# Patient Record
Sex: Female | Born: 1935
Health system: Southern US, Community
[De-identification: ages and names within clinical notes are randomized; demographics above are authoritative.]

## PROBLEM LIST (undated history)

## (undated) DIAGNOSIS — K589 Irritable bowel syndrome without diarrhea: Secondary | ICD-10-CM

## (undated) DIAGNOSIS — R011 Cardiac murmur, unspecified: Secondary | ICD-10-CM

## (undated) DIAGNOSIS — I272 Pulmonary hypertension, unspecified: Secondary | ICD-10-CM

## (undated) DIAGNOSIS — I341 Nonrheumatic mitral (valve) prolapse: Secondary | ICD-10-CM

## (undated) DIAGNOSIS — J449 Chronic obstructive pulmonary disease, unspecified: Secondary | ICD-10-CM

## (undated) HISTORY — DX: Nonrheumatic mitral (valve) prolapse: I34.1

## (undated) HISTORY — DX: Pulmonary hypertension, unspecified: I27.20

## (undated) HISTORY — DX: Chronic obstructive pulmonary disease, unspecified: J44.9

## (undated) HISTORY — PX: ABCESS DRAINAGE: SHX399

## (undated) HISTORY — DX: Irritable bowel syndrome, unspecified: K58.9

## (undated) HISTORY — DX: Cardiac murmur, unspecified: R01.1

## (undated) HISTORY — PX: APPENDECTOMY: SHX54

## (undated) HISTORY — PX: TONSILLECTOMY: SUR1361

---

## 2004-10-21 ENCOUNTER — Ambulatory Visit: Payer: Self-pay

## 2004-10-24 ENCOUNTER — Ambulatory Visit: Payer: Self-pay

## 2004-10-29 ENCOUNTER — Ambulatory Visit: Payer: Self-pay

## 2005-02-15 ENCOUNTER — Emergency Department: Payer: Self-pay | Admitting: Emergency Medicine

## 2005-07-15 ENCOUNTER — Ambulatory Visit: Payer: Self-pay | Admitting: Gastroenterology

## 2005-10-19 ENCOUNTER — Ambulatory Visit: Payer: Self-pay | Admitting: Family Medicine

## 2005-11-25 ENCOUNTER — Ambulatory Visit: Payer: Self-pay | Admitting: General Surgery

## 2006-01-21 ENCOUNTER — Ambulatory Visit: Payer: Self-pay | Admitting: Specialist

## 2006-02-08 ENCOUNTER — Emergency Department: Payer: Self-pay | Admitting: Emergency Medicine

## 2006-03-11 ENCOUNTER — Ambulatory Visit: Payer: Self-pay | Admitting: Specialist

## 2006-03-12 ENCOUNTER — Ambulatory Visit: Payer: Self-pay | Admitting: Orthopedic Surgery

## 2006-05-12 ENCOUNTER — Ambulatory Visit: Payer: Self-pay | Admitting: General Surgery

## 2006-12-06 ENCOUNTER — Ambulatory Visit: Payer: Self-pay | Admitting: General Surgery

## 2007-11-26 ENCOUNTER — Other Ambulatory Visit: Payer: Self-pay

## 2007-11-26 ENCOUNTER — Emergency Department: Payer: Self-pay | Admitting: Unknown Physician Specialty

## 2008-02-06 ENCOUNTER — Other Ambulatory Visit: Payer: Self-pay

## 2008-02-06 ENCOUNTER — Ambulatory Visit: Payer: Self-pay | Admitting: Ophthalmology

## 2008-02-06 ENCOUNTER — Ambulatory Visit: Payer: Self-pay | Admitting: Cardiology

## 2008-02-08 ENCOUNTER — Ambulatory Visit: Payer: Self-pay | Admitting: Internal Medicine

## 2008-02-20 ENCOUNTER — Ambulatory Visit: Payer: Self-pay | Admitting: Ophthalmology

## 2008-11-20 ENCOUNTER — Encounter: Payer: Self-pay | Admitting: Internal Medicine

## 2008-12-20 ENCOUNTER — Encounter: Payer: Self-pay | Admitting: Cardiovascular Disease

## 2009-05-27 ENCOUNTER — Ambulatory Visit: Payer: Self-pay | Admitting: Internal Medicine

## 2009-06-21 ENCOUNTER — Encounter: Payer: Self-pay | Admitting: Cardiovascular Disease

## 2010-02-26 ENCOUNTER — Ambulatory Visit: Payer: Self-pay | Admitting: Cardiovascular Disease

## 2010-02-26 DIAGNOSIS — J4489 Other specified chronic obstructive pulmonary disease: Secondary | ICD-10-CM | POA: Insufficient documentation

## 2010-02-26 DIAGNOSIS — J449 Chronic obstructive pulmonary disease, unspecified: Secondary | ICD-10-CM

## 2010-02-26 DIAGNOSIS — I2789 Other specified pulmonary heart diseases: Secondary | ICD-10-CM | POA: Insufficient documentation

## 2010-02-27 ENCOUNTER — Encounter: Payer: Self-pay | Admitting: Cardiovascular Disease

## 2010-02-28 ENCOUNTER — Telehealth: Payer: Self-pay | Admitting: Cardiovascular Disease

## 2010-03-03 ENCOUNTER — Encounter: Payer: Self-pay | Admitting: Cardiovascular Disease

## 2010-03-05 ENCOUNTER — Encounter: Payer: Self-pay | Admitting: Cardiovascular Disease

## 2010-03-12 ENCOUNTER — Ambulatory Visit: Payer: Self-pay | Admitting: Internal Medicine

## 2010-03-17 ENCOUNTER — Encounter: Payer: Self-pay | Admitting: Cardiovascular Disease

## 2010-04-09 ENCOUNTER — Encounter: Payer: Self-pay | Admitting: Cardiovascular Disease

## 2010-04-29 ENCOUNTER — Encounter: Payer: Self-pay | Admitting: Cardiovascular Disease

## 2010-05-07 ENCOUNTER — Ambulatory Visit: Payer: Self-pay | Admitting: Anesthesiology

## 2010-09-03 ENCOUNTER — Ambulatory Visit: Payer: Self-pay | Admitting: Internal Medicine

## 2010-10-07 NOTE — Letter (Signed)
Summary: release of records  release of records   Imported By: Frazier Butt Chriscoe 03/05/2010 15:31:25  _____________________________________________________________________  External Attachment:    Type:   Image     Comment:   External Document

## 2010-10-07 NOTE — Progress Notes (Signed)
----   Converted from flag ---- ---- 02/28/2010 2:48 PM, Bishop Dublin, CMA wrote: Toni Amend with Home health called on Cathy Russell (DOB: 1924/08/08) regarding referral for COPD.  Toni Amend spoke with [name] today and she doesn't want anyone to come today 02/28/10,03/01/10 or 03/02/10. Patient has not refused home health but doesn't want anyone there for the weekend.    Toni Amend Phone [phone]  Thank you, Gwynneth Munson ------------------------------

## 2010-10-07 NOTE — Letter (Signed)
Summary: Historic Patient File  Historic Patient File   Imported By: Park Breed 03/17/2010 08:57:52  _____________________________________________________________________  External Attachment:    Type:   Image     Comment:   External Document

## 2010-10-07 NOTE — Letter (Signed)
Summary: Historic Patient File  Historic Patient File   Imported By: West Carbo 02/27/2010 12:07:32  _____________________________________________________________________  External Attachment:    Type:   Image     Comment:   External Document

## 2010-10-07 NOTE — Assessment & Plan Note (Signed)
Summary: NP6/AMD   Visit Type:  Initial Consult Primary Provider:  Duncan Dull, M.D.  CC:  c/o thumping in chest, swelling in ankles, rapid heart beats, s.o.b., and lightheadedness.Cathy Russell  History of Present Illness: Cathy Russell is a very pleasant 77 year old woman with a history of COPD, 40 year smoking history stopped in the early 1990s, history of palpitations, edema, chronic back pain he uses oxygen and a walker and is essentially housebound, inflammatory bowel disease with periodic flares that take her to the emergency room who presents to establish care for her cardiac issues.  We do not have all of her apart from an echocardiogram from 5 years ago. This showed moderate tricuspid regurgitation, systolic function of 50%. No estimation was made of her right ventricular systolic pressures.  She takes Lasix daily for her edema and shortness of breath. Her blood pressure has been well-controlled. Her breathing is poor though stable though some days if she drinks or eats too much salt, she has more shortness of breath. She has occasional palpitations though she states these are manageable  EKG shows normal sinus rhythm with rate of 73 beats per minute, first degree AV block, right bundle branch block with left anterior fascicular block. No significant ST or T wave changes  Preventive Screening-Counseling & Management  Alcohol-Tobacco     Smoking Status: quit > 6 months      Drug Use:  no.     Current Medications (verified): 1)  Synthroid 88 Mcg Tabs (Levothyroxine Sodium) .Cathy Russell.. 1 Once Daily 2)  Omeprazole 20 Mg Cpdr (Omeprazole) .Cathy Russell.. 1 Once Daily 3)  Furosemide 40 Mg Tabs (Furosemide) .Cathy Russell.. 1 Once Daily 4)  Metoprolol Tartrate 50 Mg Tabs (Metoprolol Tartrate) .Cathy Russell.. 1 Two Times A Day 5)  Losartan Potassium 50 Mg Tabs (Losartan Potassium) .Cathy Russell.. 1 Once Daily 6)  Spiriva Handihaler 18 Mcg Caps (Tiotropium Bromide Monohydrate) .... 2 Puffs Once Daily 7)  Advair Diskus 100-50 Mcg/dose Aepb  (Fluticasone-Salmeterol) .... 2 Puffs Once Daily 8)  Lovastatin 20 Mg Tabs (Lovastatin) .Cathy Russell.. 1 At Bedtime 9)  Aspirin 81 Mg Chew (Aspirin) .Cathy Russell.. 1 Once Daily 10)  Centrum Silver  Tabs (Multiple Vitamins-Minerals) .Cathy Russell.. 1 Once Daily 11)  Vitamin D3 1000 Unit Tabs (Cholecalciferol) .Cathy Russell.. 1 Once Daily 12)  Calcium Carbonate 600 Mg Tabs (Calcium Carbonate) .Cathy Russell.. 1-2 Once Daily 13)  Flonase 50 Mcg/act Susp (Fluticasone Propionate) .Cathy Russell.. 1 Spray Each Nostril Once Daily 14)  Hyomax-Sl 0.125 Mg Subl (Hyoscyamine Sulfate) .... As Needed 15)  Meclizine Hcl 12.5 Mg Tabs (Meclizine Hcl) .... As Needed 16)  Hydrocodone-Acetaminophen 5-500 Mg Tabs (Hydrocodone-Acetaminophen) .... As Needed 17)  Donnatal 16.2 Mg/81ml Elix (Belladonna Alk-Phenobarbital) .... As Needed 18)  Oxygen  Allergies (verified): 1)  ! Pcn 2)  ! Erythromycin 3)  ! Vibramycin 4)  ! Sulfa  Past History:  Family History: Last updated: 02/26/2010 rheumatic fever: Brother pacemaker: Brother asthma:mother emphysema:father aneurysm: daugter  Social History: Last updated: 02/26/2010 Tobacco Use - Former. quit 1991;smoked x 43 yrs. Alcohol Use - no Drug Use - no  Risk Factors: Smoking Status: quit > 6 months (02/26/2010)  Past Medical History: C O P D MVP IBS  Past Surgical History: Appendectomy Tonsillectomy Bilateral abcess in arms  Family History: rheumatic fever: Brother pacemaker: Brother asthma:mother emphysema:father aneurysm: daugter  Social History: Tobacco Use - Former. quit 1991;smoked x 43 yrs. Alcohol Use - no Drug Use - no Smoking Status:  quit > 6 months Drug Use:  no  Review of Systems  The patient complains of dyspnea on exertion and difficulty walking.  The patient denies fever, weight loss, weight gain, vision loss, decreased hearing, hoarseness, chest pain, syncope, peripheral edema, prolonged cough, abdominal pain, incontinence, muscle weakness, depression, and enlarged lymph nodes.           Severe back pain  Vital Signs:  Patient profile:   77 year old female Height:      65 inches Weight:      175 pounds BMI:     29.23 Pulse rate:   73 / minute BP sitting:   122 / 64  (left arm) Cuff size:   large  Vitals Entered By: Bishop Dublin, CMA (February 26, 2010 3:46 PM)  Physical Exam  General:  Well developed, well nourished, in no acute distress. Head:  normocephalic and atraumatic Neck:  Neck supple, no JVD. No masses, thyromegaly or abnormal cervical nodes. Lungs:  Mildly decreased BS throughout b/l Heart:  Non-displaced PMI, chest non-tender; regular rate and rhythm, S1, S2 with II/VI SEM LSB, no rubs or gallops. Carotid upstroke normal, no bruit.  Pedals normal pulses. Trace LE  edema, no varicosities. Abdomen:  Bowel sounds positive; abdomen soft and non-tender without masses Msk:  Back normal, normal gait. Muscle strength and tone normal. Pulses:  pulses normal in all 4 extremities Extremities:  No clubbing or cyanosis. Neurologic:  Alert and oriented x 3.   Impression & Recommendations:  Problem # 1:  PULMONARY HYPERTENSION (ICD-416.8) history of shortness of breath, tricuspid regurgitation, lower extremity edema is consistent with pulmonary hypertension Will try to obtain her most recent echo and stress test from the outside clinic. She is on Lasix daily and she is due to have her blood work checked tomorrow with Dr. Darrick Huntsman. Her shortness of breath has been stable. 4 increasing shortness of breath, we would increase her Lasix and have her cut back on her fluid intake.  She has significant difficulty at home due to her chronic back pain, shortness of breath, pulmonary hypertension and desaturations. She is essentially homebound. I will put a request into a home service as she reports she has a messy house and has difficulty maintaining the household, shopping, ADLs.  Orders: Home Health Referral (Home Health)  Problem # 2:  CHRONIC AIRWAY OBSTRUCTION NEC  (ICD-496) Long smoking history. She is on metoprolol. If her breathing gets worse, one possibility would be to change her to Bystolic.  Her updated medication list for this problem includes:    Spiriva Handihaler 18 Mcg Caps (Tiotropium bromide monohydrate) .Cathy Russell... 2 puffs once daily    Advair Diskus 100-50 Mcg/dose Aepb (Fluticasone-salmeterol) .Cathy Russell... 2 puffs once daily  Orders: Home Health Referral [facility])  Patient Instructions: 1)  Your physician recommends that you continue on your current medications as directed. Please refer to the Current Medication list given to you today. 2)  Your physician wants you to follow-up in:  6 MONTHS You will receive a reminder letter in the mail two months in advance. If you don't receive a letter, please call our office to schedule the follow-up appointment. 3)  Home Health referral being made

## 2010-10-07 NOTE — Miscellaneous (Signed)
Summary: Orders Update  Clinical Lists Changes  Orders: Added new Referral order of Home Health Referral (Home Health) - Signed 

## 2010-10-07 NOTE — Miscellaneous (Signed)
Summary: Home Care Report  Home Care Report   Imported By: Frazier Butt Chriscoe 04/09/2010 16:11:56  _____________________________________________________________________  External Attachment:    Type:   Image     Comment:   External Document

## 2010-10-07 NOTE — Miscellaneous (Signed)
Summary: Cathy Russell   Imported By: Harlon Flor 04/29/2010 11:54:00  _____________________________________________________________________  External Attachment:    Type:   Image     Comment:   External Document

## 2011-03-17 ENCOUNTER — Encounter: Payer: Self-pay | Admitting: Cardiovascular Disease

## 2011-03-19 ENCOUNTER — Ambulatory Visit: Payer: Self-pay | Admitting: Cardiovascular Disease

## 2011-03-27 ENCOUNTER — Encounter: Payer: Self-pay | Admitting: Cardiovascular Disease

## 2011-03-27 ENCOUNTER — Ambulatory Visit (INDEPENDENT_AMBULATORY_CARE_PROVIDER_SITE_OTHER): Payer: Medicare Other | Admitting: Cardiovascular Disease

## 2011-03-27 VITALS — BP 137/78 | HR 76 | Ht 65.0 in | Wt 180.0 lb

## 2011-03-27 DIAGNOSIS — I2789 Other specified pulmonary heart diseases: Secondary | ICD-10-CM

## 2011-03-27 DIAGNOSIS — J449 Chronic obstructive pulmonary disease, unspecified: Secondary | ICD-10-CM

## 2011-03-27 DIAGNOSIS — I872 Venous insufficiency (chronic) (peripheral): Secondary | ICD-10-CM | POA: Insufficient documentation

## 2011-03-27 DIAGNOSIS — J4489 Other specified chronic obstructive pulmonary disease: Secondary | ICD-10-CM

## 2011-03-27 NOTE — Assessment & Plan Note (Signed)
Edema is likely secondary to venous insufficiency. She has TED hose in place which she reports helped significantly.

## 2011-03-27 NOTE — Assessment & Plan Note (Signed)
She reports having recent lab work done with Dr. Darrick Huntsman that did not suggest fluid overload. I suspect her SOB is likely secondary to poor underlying lung function. She seems stable on supplemental oxygen. Edema is relatively stable on daily diuretic.

## 2011-03-27 NOTE — Assessment & Plan Note (Signed)
COPD is being managed by Dr. Darrick Huntsman and Dr. Meredeth Ide. She has required periodic antibiotics and prednisone.

## 2011-03-27 NOTE — Progress Notes (Signed)
Patient ID: Cathy Russell, female    DOB: 1924/01/21, 77 y.o.   MRN: [id number]  HPI Comments: Cathy Russell is a very pleasant 77 year old woman with a history of COPD, 40 year smoking history stopped in the early 1990s, history of palpitations, edema, chronic back pain, on oxygen, who uses a walker and is essentially housebound, inflammatory bowel disease with periodic flares that take her to the emergency room who presents 4 routine followup. She does have chronic venous insufficiency, mild MR and TR on recent echocardiogram.     She takes Lasix daily for her edema and shortness of breath. Her blood pressure has been well-controlled. Her breathing is poor though stable. She has occasional palpitations though she states these are manageable. She uses her nebulizer once to twice a day. She feels that over the past year, her weight is up in a check of the records shows that she is up 5 pounds from last year in June. She does have an occasional cough. In May she received prednisone and levofloxacin for suspected COPD exacerbation. In June she had a course of erythromycin. Her cough is improved.  EKG shows normal sinus rhythm with rate 80 beats per minute with rare PVC, left axis deviation, right bundle branch block, No significant ST or T wave changes      Outpatient Encounter Prescriptions as of 03/27/2011  Medication Sig Dispense Refill  . albuterol (PROVENTIL HFA;VENTOLIN HFA) 108 (90 BASE) MCG/ACT inhaler Inhale 2 puffs into the lungs every 6 (six) hours as needed.        Marland Kitchen atropine-PHENObarbital-scopolamine-hyoscyamine (DONNATAL) 16.2 MG tablet Take 1 tablet by mouth as needed.        . benzonatate (TESSALON) 200 MG capsule Take 200 mg by mouth 3 (three) times daily as needed.        . Calcium-Vitamin D-Vitamin K (VIACTIV) 500-100-40 MG-UNT-MCG CHEW Chew 1 tablet by mouth as directed.        Clinical research associate Bandages & Supports (FUTURO FIRM COMPRESSION HOSE) MISC by Does not apply route as directed.         . fluticasone (FLONASE) 50 MCG/ACT nasal spray Place 2 sprays into the nose daily.        . Fluticasone-Salmeterol (ADVAIR DISKUS) 100-50 MCG/DOSE AEPB Inhale 2 puffs into the lungs daily.        . furosemide (LASIX) 40 MG tablet Take 40 mg by mouth daily.        Marland Kitchen guaiFENesin (ROBITUSSIN) 100 MG/5ML liquid Take 200 mg by mouth 3 (three) times daily as needed.        Marland Kitchen HYDROcodone-acetaminophen (VICODIN) 5-500 MG per tablet Take 1 tablet by mouth as needed.        . hyoscyamine (LEVSIN SL) 0.125 MG SL tablet Place 0.125 mg under the tongue as needed.        Marland Kitchen levothyroxine (SYNTHROID, LEVOTHROID) 88 MCG tablet Take 88 mcg by mouth daily.        Marland Kitchen losartan (COZAAR) 50 MG tablet Take 50 mg by mouth daily.        Marland Kitchen lovastatin (MEVACOR) 20 MG tablet Take 20 mg by mouth at bedtime.        . meclizine (ANTIVERT) 12.5 MG tablet Take 12.5 mg by mouth as needed.        . metoprolol (LOPRESSOR) 50 MG tablet Take 50 mg by mouth 2 (two) times daily.        . Multiple Vitamins-Minerals (CENTRUM SILVER) tablet Take 1 tablet  by mouth daily.        . NON FORMULARY Inhale 2 L into the lungs continuous. OXYGEN.      . NON FORMULARY welleese liquid calcium/vit d/magnesium Take 2 tblsp daily       . omeprazole (PRILOSEC) 20 MG capsule Take 20 mg by mouth daily.        Marland Kitchen tiotropium (SPIRIVA) 18 MCG inhalation capsule Place 36 mcg into inhaler and inhale daily.        Marland Kitchen aspirin 81 MG tablet Take 81 mg by mouth daily.           Review of Systems  Constitutional: Positive for fatigue.  HENT: Negative.   Eyes: Negative.   Respiratory: Positive for cough and shortness of breath.   Cardiovascular: Negative.   Gastrointestinal: Negative.   Musculoskeletal: Positive for gait problem.  Skin: Negative.   Neurological: Positive for weakness.  Hematological: Negative.   Psychiatric/Behavioral: Negative.   All other systems reviewed and are negative.    BP 137/78  Pulse 76  Ht 5\' 5"  (1.651 m)  Wt 180 lb  (81.647 kg)  BMI 29.95 kg/m2  SpO2 94%  Physical Exam  Nursing note and vitals reviewed. Constitutional: She is oriented to person, place, and time. She appears well-developed and well-nourished.  HENT:  Head: Normocephalic.  Nose: Nose normal.  Mouth/Throat: Oropharynx is clear and moist.  Eyes: Conjunctivae are normal. Pupils are equal, round, and reactive to light.  Neck: Normal range of motion. Neck supple. No JVD present.  Cardiovascular: Normal rate, regular rhythm, S1 normal, S2 normal, normal heart sounds and intact distal pulses.  Exam reveals no gallop and no friction rub.   No murmur heard. Pulmonary/Chest: Effort normal. No respiratory distress. She has decreased breath sounds. She has no wheezes. She has no rales. She exhibits no tenderness.       Upper airway wheeze otherwise no significant wheezing.  Abdominal: Soft. Bowel sounds are normal. She exhibits no distension. There is no tenderness.  Musculoskeletal: Normal range of motion. She exhibits no edema and no tenderness.  Lymphadenopathy:    She has no cervical adenopathy.  Neurological: She is alert and oriented to person, place, and time. Coordination normal.  Skin: Skin is warm and dry. No rash noted. No erythema.  Psychiatric: She has a normal mood and affect. Her behavior is normal. Judgment and thought content normal.         Assessment and Plan

## 2011-03-27 NOTE — Patient Instructions (Signed)
You are doing well. No medication changes were made. Please call us if you have new issues that need to be addressed before your next appt.  We will call you for a follow up Appt. In 6 months  

## 2011-05-14 ENCOUNTER — Other Ambulatory Visit: Payer: Self-pay | Admitting: Internal Medicine

## 2011-05-14 DIAGNOSIS — J449 Chronic obstructive pulmonary disease, unspecified: Secondary | ICD-10-CM

## 2011-05-15 MED ORDER — FLUTICASONE PROPIONATE 50 MCG/ACT NA SUSP: 2.0000 | Freq: Every day | NASAL | Status: AC

## 2011-05-17 ENCOUNTER — Emergency Department: Payer: Self-pay | Admitting: Emergency Medicine

## 2011-06-09 ENCOUNTER — Other Ambulatory Visit: Payer: Self-pay | Admitting: Internal Medicine

## 2011-06-09 NOTE — Telephone Encounter (Signed)
Patient needs a refill on her lovastatin. Please contact patient when complete.

## 2011-06-10 ENCOUNTER — Other Ambulatory Visit: Payer: Self-pay | Admitting: Internal Medicine

## 2011-06-10 MED ORDER — LOVASTATIN 20 MG PO TABS: 20.0000 mg | ORAL_TABLET | Freq: Every day | ORAL | Status: AC

## 2011-06-29 ENCOUNTER — Telehealth: Payer: Self-pay | Admitting: *Deleted

## 2011-06-29 NOTE — Telephone Encounter (Signed)
There is no way to contact patient. We have no number on file for her.

## 2011-06-29 NOTE — Telephone Encounter (Signed)
Message copied by Jobie Quaker on Mon Jun 29, 2011  2:44 PM ------      Message from: Duncan Dull      Created: Wed Jun 24, 2011  6:44 PM      Regarding: Leg Pain       Patient called the on call nurse on the evening of the 16th because of lower leg stabbing pain, intermittent.  If she is still having it she needs to make an appt to be seen.

## 2011-07-06 ENCOUNTER — Telehealth: Payer: Self-pay | Admitting: Internal Medicine

## 2011-07-06 NOTE — Telephone Encounter (Signed)
Ok to add her.  You do not need to ask permission unless you are booking a 16th patient for a full day or a 9th patient for a half day.  thanks

## 2011-07-06 NOTE — Telephone Encounter (Signed)
Patient's daughter wants her seen this week. Her mother told her it feels like electricity is shooting through her legs.

## 2011-07-10 NOTE — Telephone Encounter (Signed)
Patient put on schedule for 07-14-11.

## 2011-07-14 ENCOUNTER — Ambulatory Visit: Payer: PRIVATE HEALTH INSURANCE | Admitting: Internal Medicine

## 2011-07-21 ENCOUNTER — Ambulatory Visit (INDEPENDENT_AMBULATORY_CARE_PROVIDER_SITE_OTHER): Payer: Medicare Other | Admitting: Internal Medicine

## 2011-07-21 ENCOUNTER — Encounter: Payer: Self-pay | Admitting: Internal Medicine

## 2011-07-21 DIAGNOSIS — M79609 Pain in unspecified limb: Secondary | ICD-10-CM

## 2011-07-21 DIAGNOSIS — Z23 Encounter for immunization: Secondary | ICD-10-CM

## 2011-07-21 DIAGNOSIS — M79606 Pain in leg, unspecified: Secondary | ICD-10-CM

## 2011-07-21 DIAGNOSIS — R109 Unspecified abdominal pain: Secondary | ICD-10-CM

## 2011-07-21 MED ORDER — FUROSEMIDE 40 MG PO TABS: 40.0000 mg | ORAL_TABLET | Freq: Every day | ORAL | Status: DC

## 2011-07-21 MED ORDER — MECLIZINE HCL 12.5 MG PO TABS: 12.5000 mg | ORAL_TABLET | ORAL | Status: DC | PRN

## 2011-07-21 MED ORDER — HYDROCODONE-ACETAMINOPHEN 5-500 MG PO TABS: 1.0000 | ORAL_TABLET | ORAL | Status: DC | PRN

## 2011-07-21 MED ORDER — LORAZEPAM 0.5 MG PO TABS: 0.5000 mg | ORAL_TABLET | Freq: Every day | ORAL | Status: AC | PRN

## 2011-07-21 MED ORDER — METOPROLOL TARTRATE 50 MG PO TABS: 50.0000 mg | ORAL_TABLET | Freq: Two times a day (BID) | ORAL | Status: AC

## 2011-07-21 MED ORDER — LOSARTAN POTASSIUM 50 MG PO TABS: 50.0000 mg | ORAL_TABLET | Freq: Every day | ORAL | Status: DC

## 2011-07-21 MED ORDER — BELLADONNA ALK-PHENOBARBITAL 16.2 MG/5ML PO ELIX: 10.0000 mL | ORAL_SOLUTION | Freq: Every day | ORAL | Status: DC | PRN

## 2011-07-21 NOTE — Progress Notes (Signed)
Subjective:    Patient ID: Cathy Russell, female    DOB: 1924/07/31, 77 y.o.   MRN: [id number]  HPI 77 yo white female with history of advanced COPD, 02 dependent,  Sedentary lifestyle, with minimal walking, presents with right medial calf pain that has been occurring on and off for the past month. Pain is sharp,lasts a few seconds at a time, occurring throughout the day.  She denies any recent travel, but is very sedentary secidbary to COPD.  She has not een wearing her compression stockings for the past two weeks because of recent bruises to both feetwhich resulted  from minor trauma which occurred at home.  Marland Kitchen  She was treated for COPD exacerbation back in September by Urgent Care MD with predisone and a course of antibiotics but did not take the prednisone because of the size of the 20 mg tablets and the dosing which was not her usual taper.  Past Medical History  Diagnosis Date  . MVP (mitral valve prolapse)   . IBS (irritable bowel syndrome)   . Heart murmur   . COPD (chronic obstructive pulmonary disease)     02 dependent   Current Outpatient Prescriptions on File Prior to Visit  Medication Sig Dispense Refill  . albuterol (PROVENTIL HFA;VENTOLIN HFA) 108 (90 BASE) MCG/ACT inhaler Inhale 2 puffs into the lungs every 6 (six) hours as needed.        . Calcium-Vitamin D-Vitamin K (VIACTIV) 500-100-40 MG-UNT-MCG CHEW Chew 1 tablet by mouth as directed.        Clinical research associate Bandages & Supports (FUTURO FIRM COMPRESSION HOSE) MISC by Does not apply route as directed.        . fluticasone (FLONASE) 50 MCG/ACT nasal spray Place 2 sprays into the nose daily.  16 g  3  . Fluticasone-Salmeterol (ADVAIR DISKUS) 100-50 MCG/DOSE AEPB Inhale 2 puffs into the lungs daily.        Marland Kitchen guaiFENesin (ROBITUSSIN) 100 MG/5ML liquid Take 200 mg by mouth 3 (three) times daily as needed.        . hyoscyamine (LEVSIN SL) 0.125 MG SL tablet Place 0.125 mg under the tongue as needed.        Marland Kitchen levothyroxine (SYNTHROID,  LEVOTHROID) 88 MCG tablet Take 88 mcg by mouth daily.        Marland Kitchen lovastatin (MEVACOR) 20 MG tablet Take 1 tablet (20 mg total) by mouth at bedtime.  90 tablet  3  . NON FORMULARY Inhale 2 L into the lungs continuous. OXYGEN.      . NON FORMULARY welleese liquid calcium/vit d/magnesium Take 2 tblsp daily       . omeprazole (PRILOSEC) 20 MG capsule Take 20 mg by mouth daily.        Marland Kitchen tiotropium (SPIRIVA) 18 MCG inhalation capsule Place 36 mcg into inhaler and inhale daily.          Review of Systems  Constitutional: Negative for fever, chills and unexpected weight change.  HENT: Negative for hearing loss, ear pain, nosebleeds, congestion, sore throat, facial swelling, rhinorrhea, sneezing, mouth sores, trouble swallowing, neck pain, neck stiffness, voice change, postnasal drip, sinus pressure, tinnitus and ear discharge.   Eyes: Negative for pain, discharge, redness and visual disturbance.  Respiratory: Positive for shortness of breath. Negative for cough, chest tightness, wheezing and stridor.   Cardiovascular: Negative for chest pain, palpitations and leg swelling.  Musculoskeletal: Positive for myalgias. Negative for arthralgias.  Skin: Negative for color change and rash.  Neurological: Negative  for dizziness, weakness, light-headedness and headaches.  Hematological: Negative for adenopathy.       Objective:   Physical Exam  Constitutional: She is oriented to person, place, and time. She appears well-developed and well-nourished.  HENT:  Mouth/Throat: Oropharynx is clear and moist.  Eyes: EOM are normal. Pupils are equal, round, and reactive to light. No scleral icterus.  Neck: Normal range of motion. Neck supple. No JVD present. No thyromegaly present.  Cardiovascular: Normal rate, regular rhythm, normal heart sounds and intact distal pulses.   Pulmonary/Chest: Effort normal and breath sounds normal.  Abdominal: Soft. Bowel sounds are normal. She exhibits no mass. There is no  tenderness.  Musculoskeletal: Normal range of motion. She exhibits edema.       Right shoulder: She exhibits swelling.       Feet:  Lymphadenopathy:    She has no cervical adenopathy.  Neurological: She is alert and oriented to person, place, and time.  Skin: Skin is warm and dry. Bruising and ecchymosis noted.     Psychiatric: She has a normal mood and affect.          Assessment & Plan:  Right LE pain and swelling: because of her risk for VTE due to immobility , she needs an ultrasound to rule out DVT.  If negative, will recommed leg elevation and resume use of compression stockings.  Continue furosemide 40 mg daily;  BMET due.   COPD:  She is currently not hypoxic and her lungs are clear. Continue current medications.

## 2011-07-22 ENCOUNTER — Encounter: Payer: Self-pay | Admitting: Internal Medicine

## 2011-07-22 DIAGNOSIS — J449 Chronic obstructive pulmonary disease, unspecified: Secondary | ICD-10-CM | POA: Insufficient documentation

## 2011-07-24 ENCOUNTER — Telehealth: Payer: Self-pay | Admitting: Internal Medicine

## 2011-07-24 ENCOUNTER — Ambulatory Visit: Payer: Self-pay | Admitting: Internal Medicine

## 2011-07-24 NOTE — Telephone Encounter (Signed)
No DVT on ultrasound of leg.

## 2011-07-27 NOTE — Telephone Encounter (Signed)
Notified patient of results.  She stated she is still having sharpe pains in her legs and wanted to know what you suggest she do about this.  Please advise.

## 2011-07-27 NOTE — Telephone Encounter (Signed)
Has she tried combining tylenol with Advil?  Has she tried soaking them in warm water with epsom salts? I would try both of these modalities  First.  Use the amount suggested on the bottle for the tylenol and ibuprofen

## 2011-07-27 NOTE — Telephone Encounter (Signed)
Patient notified

## 2011-08-07 ENCOUNTER — Other Ambulatory Visit: Payer: Self-pay | Admitting: Internal Medicine

## 2011-08-07 MED ORDER — FUROSEMIDE 40 MG PO TABS: 40.0000 mg | ORAL_TABLET | Freq: Every day | ORAL | Status: AC

## 2011-08-07 MED ORDER — LEVOTHYROXINE SODIUM 88 MCG PO TABS: 88.0000 ug | ORAL_TABLET | Freq: Every day | ORAL | Status: AC

## 2011-08-07 MED ORDER — LOSARTAN POTASSIUM 50 MG PO TABS: 50.0000 mg | ORAL_TABLET | Freq: Every day | ORAL | Status: AC

## 2011-08-11 ENCOUNTER — Other Ambulatory Visit: Payer: Self-pay | Admitting: Internal Medicine

## 2011-08-12 ENCOUNTER — Encounter: Payer: Self-pay | Admitting: Internal Medicine

## 2011-08-18 ENCOUNTER — Other Ambulatory Visit: Payer: Self-pay | Admitting: Internal Medicine

## 2011-08-18 DIAGNOSIS — J449 Chronic obstructive pulmonary disease, unspecified: Secondary | ICD-10-CM

## 2011-08-19 ENCOUNTER — Ambulatory Visit (INDEPENDENT_AMBULATORY_CARE_PROVIDER_SITE_OTHER): Payer: Medicare Other | Admitting: Pulmonary Disease

## 2011-08-19 ENCOUNTER — Encounter: Payer: Self-pay | Admitting: Pulmonary Disease

## 2011-08-19 VITALS — BP 112/58 | HR 69 | Temp 97.6°F | Ht 61.0 in | Wt 171.4 lb

## 2011-08-19 DIAGNOSIS — J4489 Other specified chronic obstructive pulmonary disease: Secondary | ICD-10-CM

## 2011-08-19 DIAGNOSIS — I2789 Other specified pulmonary heart diseases: Secondary | ICD-10-CM

## 2011-08-19 DIAGNOSIS — J449 Chronic obstructive pulmonary disease, unspecified: Secondary | ICD-10-CM

## 2011-08-19 DIAGNOSIS — R05 Cough: Secondary | ICD-10-CM

## 2011-08-19 DIAGNOSIS — R059 Cough, unspecified: Secondary | ICD-10-CM | POA: Insufficient documentation

## 2011-08-19 NOTE — Assessment & Plan Note (Addendum)
COPD: GOLD Stage III (based on symptoms, frequency of exacerbation) Combined recommendations from the [employer]	[place], [employer], Designer, television/film set, European Respiratory Society (Qaseem A et al, Ann Intern Med. 2011;155(3):179) recommends tobacco cessation, pulmonary rehab (for symptomatic patients with an FEV1 < 50% predicted), supplemental oxygen (for patients with SaO2 <88% or paO2 <55), and appropriate bronchodilator therapy.  In regards to long acting bronchodilators, they recommend monotherapy (FEV1 60-80% with symptoms weak evidence, FEV1 with symptoms <60% strong evidence), or combination therapy (FEV1 <60% with symptoms, strong recommendation, moderate evidence).  One should also provide patients with annual immunizations and consider therapy for prevention of COPD exacerbations (ie. roflumilast or azithromycin) when appopriate.  -O2 therapy: 2.5 L/min -Immunizations: up to date -Tobacco use: prior heavy use, quit several years ago -Exercise: profoundly deconditioned, needs to exercise more -Bronchodilator therapy: currently on Spiriva, Advair, albuterol, no indication to change -Exacerbation prevention: consider adding roflumilast on next visit

## 2011-08-19 NOTE — Assessment & Plan Note (Signed)
Her cough is certainly related to her chronic bronchitis and COPD, but I think perhaps exacerbated by allergic rhinitis.  She notes that her house has been more dusty and has some mold in the last few months and this coincides with worsening wheezing, cough, and sputum production.  She coughs up thick plugs recently.  Patients with worsening COPD and productive cough need to be evaluated for allergic bronchopulmonary aspergillosis (ABPA).  -advised continued use of flonase -advised starting to use Lloyd [employer] Med rinses bid -send IgE level to screen for ABPA, if elevated send full panel of tests/CT scan to evaluate for ABPA

## 2011-08-19 NOTE — Assessment & Plan Note (Signed)
Discuss OSA symptoms on next visit, needs sleep study if not already performed.

## 2011-08-19 NOTE — Progress Notes (Signed)
Subjective:    Patient ID: Cathy Russell, female    DOB: May 29, 1924, 77 y.o.   MRN: [id number]  HPI 77 y/o female with COPD and pulmonary hypertension presents to our clinic for evaluation of COPD.  She states that she has seen pulmonologists for several years for her COPD.  She was a heavy smoker for 43 years smoking at least 2 packs per day.    She has never been hospitalized for her COPD but it sounds as if she requires steroids 1-2 times per year for exacerbations.  She notes that in the last several month she has noted increasing wheezing, shortness of breath, nose congestion, and sputum production. She describes coughing up "plugs" and "clumps" throughout the day.   In the last few months she has noticed more dust in her house.  She thinks that there may be some mold in the bathroom, but she doesn't think that it is common throughout the house.  She also notes that she rented a room in her house to someone with birds and who smoked.  Fortunately this person moved out a few months ago.  She gets short of breath with minimal exertion, such as moving from the bathroom to the bedroom at night.  Past Medical History  Diagnosis Date  . MVP (mitral valve prolapse)   . IBS (irritable bowel syndrome)   . Heart murmur   . COPD (chronic obstructive pulmonary disease)     02 dependent  . Pulmonary hypertension      Family History  Problem Relation Age of Onset  . Asthma Mother   . Emphysema Father     was a smoker  . Rheumatic fever Brother   . Aneurysm Daughter      History   Social History  . Marital Status: Widowed    Spouse Name: N/A    Number of Children: 3  . Years of Education: N/A   Occupational History  . Retired     Loss adjuster, chartered for [employer]    Social History Main Topics  . Smoking status: Former Smoker -- 2.0 packs/day for 43 years    Types: Cigarettes    Quit date: 09/07/1989  . Smokeless tobacco: Never Used   Comment: Smoked for 43 years  . Alcohol Use: No    . Drug Use: No  . Sexually Active: Not on file   Other Topics Concern  . Not on file   Social History Narrative  . No narrative on file     Allergies  Allergen Reactions  . Doxycycline Hyclate   . Erythromycin   . Penicillins   . Sulfonamide Derivatives      Outpatient Prescriptions Prior to Visit  Medication Sig Dispense Refill  . albuterol (PROVENTIL HFA;VENTOLIN HFA) 108 (90 BASE) MCG/ACT inhaler Inhale 2 puffs into the lungs every 6 (six) hours as needed.        . belladonna-PHENObarbital (DONNATAL) 16.2 MG/5ML ELIX Take 10 mLs (32.4 mg total) by mouth daily as needed for cramping.  120 mL  4  . Calcium-Vitamin D-Vitamin K (VIACTIV) 500-100-40 MG-UNT-MCG CHEW Chew 1 tablet by mouth as directed.        Clinical research associate Bandages & Supports (FUTURO FIRM COMPRESSION HOSE) MISC by Does not apply route as directed.        . fluticasone (FLONASE) 50 MCG/ACT nasal spray Place 2 sprays into the nose daily.  16 g  3  . Fluticasone-Salmeterol (ADVAIR DISKUS) 100-50 MCG/DOSE AEPB Inhale 1 puff into the  lungs 2 (two) times daily.       . furosemide (LASIX) 40 MG tablet Take 1 tablet (40 mg total) by mouth daily.  90 tablet  3  . guaiFENesin (ROBITUSSIN) 100 MG/5ML liquid Take 200 mg by mouth 3 (three) times daily as needed.        Marland Kitchen HYDROcodone-acetaminophen (VICODIN) 5-500 MG per tablet Take 1 tablet by mouth as needed.  30 tablet  3  . hyoscyamine (LEVSIN SL) 0.125 MG SL tablet Place 0.125 mg under the tongue as needed.        Marland Kitchen levothyroxine (SYNTHROID, LEVOTHROID) 88 MCG tablet Take 1 tablet (88 mcg total) by mouth daily.  90 tablet  3  . LORazepam (ATIVAN) 0.5 MG tablet Take 1 tablet (0.5 mg total) by mouth daily as needed for anxiety.  30 tablet  3  . losartan (COZAAR) 50 MG tablet Take 1 tablet (50 mg total) by mouth daily.  90 tablet  3  . lovastatin (MEVACOR) 20 MG tablet Take 1 tablet (20 mg total) by mouth at bedtime.  90 tablet  3  . meclizine (ANTIVERT) 12.5 MG tablet Take 1 tablet  (12.5 mg total) by mouth as needed.  30 tablet  3  . metoprolol (LOPRESSOR) 50 MG tablet Take 1 tablet (50 mg total) by mouth 2 (two) times daily.  60 tablet  3  . NON FORMULARY Inhale 2 L into the lungs continuous. OXYGEN.      . NON FORMULARY welleese liquid calcium/vit d/magnesium Take 2 tblsp daily       . omeprazole (PRILOSEC) 20 MG capsule Take 20 mg by mouth daily.        Marland Kitchen tiotropium (SPIRIVA) 18 MCG inhalation capsule Place 36 mcg into inhaler and inhale daily.            Review of Systems  Constitutional: Negative for fever, chills and unexpected weight change.  HENT: Positive for sinus pressure. Negative for ear pain, nosebleeds, congestion, sore throat, rhinorrhea, sneezing, trouble swallowing, dental problem, voice change and postnasal drip.   Eyes: Negative for visual disturbance.  Respiratory: Positive for cough and shortness of breath. Negative for choking.   Cardiovascular: Positive for leg swelling. Negative for chest pain.  Gastrointestinal: Negative for vomiting, abdominal pain and diarrhea.  Genitourinary: Negative for difficulty urinating.  Musculoskeletal: Negative for arthralgias.  Skin: Negative for rash.  Neurological: Negative for tremors, syncope and headaches.  Hematological: Bruises/bleeds easily.       Objective:   Physical Exam  Filed Vitals:   08/19/11 1539  BP: 112/58  Pulse: 69  Temp: 97.6 F (36.4 C)  TempSrc: Oral  Height: 5\' 1"  (1.549 m)  Weight: 171 lb 6.4 oz (77.747 kg)  SpO2: 92%   Gen: chronically ill appearing, no acute distress HEENT: NCAT, PERRL, EOMi, OP clear,  Neck: supple without masses PULM: scattered wheezes throughout CV: RRR, slight systolic murmur, no JVD AB: BS+, soft, nontender, no hsm Ext: warm, pitting edema noted, no clubbing, no cyanosis Derm: no rash or skin breakdown Neuro: A&Ox4, CN II-XII intact, strength 5/5 in all 4 extremities   08/19/11 In office spirometry: Ratio 70%, FEV1 1.65 (106%); review of  spirometry reveals that the FEV1 is falsely elevated due to one abnormal curve; the flow volume loops all show significant scooping which is consistent with obstruction.    Assessment & Plan:   COPD (chronic obstructive pulmonary disease) COPD: GOLD Stage III (based on symptoms, frequency of exacerbation) Combined recommendations from the American  College of Physicians, [employer]icians, Designer, television/film set, European Respiratory Society (Qaseem A et al, Ann Intern Med. 2011;155(3):179) recommends tobacco cessation, pulmonary rehab (for symptomatic patients with an FEV1 < 50% predicted), supplemental oxygen (for patients with SaO2 <88% or paO2 <55), and appropriate bronchodilator therapy.  In regards to long acting bronchodilators, they recommend monotherapy (FEV1 60-80% with symptoms weak evidence, FEV1 with symptoms <60% strong evidence), or combination therapy (FEV1 <60% with symptoms, strong recommendation, moderate evidence).  One should also provide patients with annual immunizations and consider therapy for prevention of COPD exacerbations (ie. roflumilast or azithromycin) when appopriate.  -O2 therapy: 2.5 L/min -Immunizations: up to date -Tobacco use: prior heavy use, quit several years ago -Exercise: profoundly deconditioned, needs to exercise more -Bronchodilator therapy: currently on Spiriva, Advair, albuterol, no indication to change -Exacerbation prevention: consider adding roflumilast on next visit   Cough Her cough is certainly related to her chronic bronchitis and COPD, but I think perhaps exacerbated by allergic rhinitis.  She notes that her house has been more dusty and has some mold in the last few months and this coincides with worsening wheezing, cough, and sputum production.  She coughs up thick plugs recently.  Patients with worsening COPD and productive cough need to be evaluated for allergic bronchopulmonary aspergillosis (ABPA).  -advised  continued use of flonase -advised starting to use Lloyd Huger Med rinses bid -send IgE level to screen for ABPA, if elevated send full panel of tests/CT scan to evaluate for ABPA  PULMONARY HYPERTENSION Discuss OSA symptoms on next visit, needs sleep study if not already performed.    Updated Medication List Outpatient Encounter Prescriptions as of 08/19/2011  Medication Sig Dispense Refill  . albuterol (PROVENTIL HFA;VENTOLIN HFA) 108 (90 BASE) MCG/ACT inhaler Inhale 2 puffs into the lungs every 6 (six) hours as needed.        . belladonna-PHENObarbital (DONNATAL) 16.2 MG/5ML ELIX Take 10 mLs (32.4 mg total) by mouth daily as needed for cramping.  120 mL  4  . Calcium-Vitamin D-Vitamin K (VIACTIV) 500-100-40 MG-UNT-MCG CHEW Chew 1 tablet by mouth as directed.        Clinical research associate Bandages & Supports (FUTURO FIRM COMPRESSION HOSE) MISC by Does not apply route as directed.        . fluticasone (FLONASE) 50 MCG/ACT nasal spray Place 2 sprays into the nose daily.  16 g  3  . Fluticasone-Salmeterol (ADVAIR DISKUS) 100-50 MCG/DOSE AEPB Inhale 1 puff into the lungs 2 (two) times daily.       . furosemide (LASIX) 40 MG tablet Take 1 tablet (40 mg total) by mouth daily.  90 tablet  3  . guaiFENesin (ROBITUSSIN) 100 MG/5ML liquid Take 200 mg by mouth 3 (three) times daily as needed.        Marland Kitchen HYDROcodone-acetaminophen (VICODIN) 5-500 MG per tablet Take 1 tablet by mouth as needed.  30 tablet  3  . hyoscyamine (LEVSIN SL) 0.125 MG SL tablet Place 0.125 mg under the tongue as needed.        Marland Kitchen levothyroxine (SYNTHROID, LEVOTHROID) 88 MCG tablet Take 1 tablet (88 mcg total) by mouth daily.  90 tablet  3  . LORazepam (ATIVAN) 0.5 MG tablet Take 1 tablet (0.5 mg total) by mouth daily as needed for anxiety.  30 tablet  3  . losartan (COZAAR) 50 MG tablet Take 1 tablet (50 mg total) by mouth daily.  90 tablet  3  . lovastatin (MEVACOR) 20 MG tablet Take 1  tablet (20 mg total) by mouth at bedtime.  90 tablet  3  .  meclizine (ANTIVERT) 12.5 MG tablet Take 1 tablet (12.5 mg total) by mouth as needed.  30 tablet  3  . metoprolol (LOPRESSOR) 50 MG tablet Take 1 tablet (50 mg total) by mouth 2 (two) times daily.  60 tablet  3  . NON FORMULARY Inhale 2 L into the lungs continuous. OXYGEN.      . NON FORMULARY welleese liquid calcium/vit d/magnesium Take 2 tblsp daily       . omeprazole (PRILOSEC) 20 MG capsule Take 20 mg by mouth daily.        Marland Kitchen tiotropium (SPIRIVA) 18 MCG inhalation capsule Place 36 mcg into inhaler and inhale daily.

## 2011-08-19 NOTE — Patient Instructions (Signed)
Continue taking your Spiriva and Advair as you are doing. Continue taking your nebulizers and rescue inhaler as your are doing. We will call you with the results of your blood test. Use Lloyd Huger Med rinses twice per day in each nostril. Use guaifenesin as needed for cough. Continue using the flonase as you are doing. Try using the astepro inhaler two puffs each nostril twice per day, call us if you think it helped and we can call it in. We will see you back in one month.

## 2011-08-21 LAB — IGE: IgE (Immunoglobulin E), Serum: 29.7 IU/mL (ref 0.0–180.0)

## 2011-08-28 ENCOUNTER — Telehealth: Payer: Self-pay | Admitting: Pulmonary Disease

## 2011-08-28 NOTE — Telephone Encounter (Signed)
Received copies from [facility],on 12.21.12. Forwarded 57 pages to Dr. Kendrick Fries ,for review.  sj

## 2011-09-02 ENCOUNTER — Telehealth: Payer: Self-pay | Admitting: Pulmonary Disease

## 2011-09-02 ENCOUNTER — Inpatient Hospital Stay: Payer: Self-pay | Admitting: Internal Medicine

## 2011-09-02 NOTE — Telephone Encounter (Signed)
I spoke with pt and she c/o cough w/ yellow phlem and has pink/red mixed in it, lots of wheezing, chest congestion and ribs feel sore. Pt called Dr. Melina Schools office and they could've seen her at 2:15 today but could not get a ride to see someone. Pt states she called on the on call doctor on Sunday and states they would not call her anything in. I advised pt since she has no way to be seen then she needed to see if she could find a way to [facility] or the ED. Pt then states she is going to call 911 to p/u her up and take her to the ED.

## 2011-09-03 ENCOUNTER — Encounter: Payer: Self-pay | Admitting: Internal Medicine

## 2011-09-03 DIAGNOSIS — J9600 Acute respiratory failure, unspecified whether with hypoxia or hypercapnia: Secondary | ICD-10-CM

## 2011-09-03 DIAGNOSIS — J9383 Other pneumothorax: Secondary | ICD-10-CM

## 2011-09-03 DIAGNOSIS — J939 Pneumothorax, unspecified: Secondary | ICD-10-CM | POA: Insufficient documentation

## 2011-09-03 DIAGNOSIS — J441 Chronic obstructive pulmonary disease with (acute) exacerbation: Secondary | ICD-10-CM | POA: Insufficient documentation

## 2011-09-05 DIAGNOSIS — I369 Nonrheumatic tricuspid valve disorder, unspecified: Secondary | ICD-10-CM

## 2011-09-08 LAB — PLATELET COUNT: Platelet: 307 10*3/uL (ref 150–440)

## 2011-09-14 ENCOUNTER — Telehealth: Payer: Self-pay | Admitting: Internal Medicine

## 2011-09-14 NOTE — Telephone Encounter (Signed)
Advised pt's daughter that since pt is in rehab the doctor at the facility will have to treat the patient.

## 2011-09-14 NOTE — Telephone Encounter (Signed)
[phone] Pt daughter called.  Ms vipond is in a rehab faclity  [facility].  Ms Hetty Blend stated that her mom has been coughing all night and this morning, And she is not supposed to be coughing because she had a whole in lung in hospital She has not had a bowel movement since 09/09/11. What would you recommend for her to do.

## 2011-09-15 ENCOUNTER — Encounter: Payer: Self-pay | Admitting: Internal Medicine

## 2011-09-18 ENCOUNTER — Ambulatory Visit (INDEPENDENT_AMBULATORY_CARE_PROVIDER_SITE_OTHER): Payer: Medicare Other | Admitting: Pulmonary Disease

## 2011-09-18 ENCOUNTER — Encounter: Payer: Self-pay | Admitting: Pulmonary Disease

## 2011-09-18 DIAGNOSIS — R0902 Hypoxemia: Secondary | ICD-10-CM

## 2011-09-18 DIAGNOSIS — J441 Chronic obstructive pulmonary disease with (acute) exacerbation: Secondary | ICD-10-CM

## 2011-09-18 NOTE — Assessment & Plan Note (Signed)
She has an exacerbation of there COPD despite her recent prednisone taper and z-pack.  I think that she needs a broader antibiotic when she has a flare.  Plan: -increase advair until our next visit to 250/50 -prednisone taper for two weeks -levaquin for a week

## 2011-09-18 NOTE — Assessment & Plan Note (Signed)
Due to COPD and COPD exacerbation.  Measured O2 sat in clinic is 83-85% in clinic at rest on 2L, 90% on 3L.  She says it is dropping with rehab at her facility.    Plan: -increase resting O2 to 3L/min -increase with exercise to 5L min

## 2011-09-18 NOTE — Progress Notes (Signed)
Subjective:    Patient ID: Cathy Russell, female    DOB: October 17, 1923, 78 y.o.   MRN: [id number]  Synopsis: GOLD stage IV COPD with prominent sputum production and an allergic component made worse by dust in the home and second hand cigarette smoke.  Frequent exacerbations requiring hospitalizations.  Initially seen on 08/18/12.  08/18/12 Initial Visit: 78 y/o female with COPD and pulmonary hypertension presents to our clinic for evaluation of COPD.  She states that she has seen pulmonologists for several years for her COPD.  She was a heavy smoker for 43 years smoking at least 2 packs per day.    She has never been hospitalized for her COPD but it sounds as if she requires steroids 1-2 times per year for exacerbations.  She notes that in the last several month she has noted increasing wheezing, shortness of breath, nose congestion, and sputum production. She describes coughing up "plugs" and "clumps" throughout the day.   In the last few months she has noticed more dust in her house.  She thinks that there may be some mold in the bathroom, but she doesn't think that it is common throughout the house.  She also notes that she rented a room in her house to someone with birds and who smoked.  Fortunately this person moved out a few months ago.  She gets short of breath with minimal exertion, such as moving from the bathroom to the bedroom at night.  09/18/11 Sick visit: Unfortnately Cathy Russell was admitted for a COPD exacerbation about two weeks after I saw her last in clinic.  She was admitted to [facility] and had a prednisone taper and levaquin.  She has been brought to a rehab facility, but her daughter told me that she is not doing well and needed to be seen again so we brought her here today.  She notes ongoing sputum production, wheezing and dyspnea with exertion.  She says that her O2 sats drop into the 80's with exercise at the rehab facility.  In the last few days the dyspnea has been worse.  No  fevers, chills, or chest pain.  ROS: Gen: no fevers, chills, or weight loss, positive for fatigue HEENT: no sinus complaints, trouble swallowing or ear pain PULM: per HPI CV: no new swelling or chest pain or palpitations GI: no new nausea or vomiting or diarrhea    Objective:   Physical Exam  Filed Vitals:   09/18/11 1616  BP: 90/56  Pulse: 78  Temp: 98 F (36.7 C)  TempSrc: Oral  Height: 5\' 4"  (1.626 m)  Weight: 76.839 kg (169 lb 6.4 oz)  SpO2: 90%   Gen: chronically ill appearing, no acute distress HEENT: NCAT, PERRL, EOMi, OP clear,  Neck: supple without masses PULM: worse wheezing and rhonchi on today's exam CV: RRR, slight systolic murmur, no JVD AB: BS+, soft, nontender, no hsm Ext: warm, pitting edema noted, no clubbing, no cyanosis Derm: no rash or skin breakdown Neuro: A&Ox4, CN II-XII intact, strength 5/5 in all 4 extremities   08/19/11 In office spirometry: Ratio 70%, FEV1 1.65 (106%); review of spirometry reveals that the FEV1 is falsely elevated due to one abnormal curve; the flow volume loops all show significant scooping which is consistent with obstruction.    Assessment & Plan:   COPD exacerbation She has an exacerbation of there COPD despite her recent prednisone taper and z-pack.  I think that she needs a broader antibiotic when she has a flare.  Plan: -  increase advair until our next visit to 250/50 -prednisone taper for two weeks -levaquin for a week   Hypoxemia Due to COPD and COPD exacerbation.  Measured O2 sat in clinic is 83-85% in clinic at rest on 2L, 90% on 3L.  She says it is dropping with rehab at her facility.    Plan: -increase resting O2 to 3L/min -increase with exercise to 5L min     Updated Medication List Outpatient Encounter Prescriptions as of 09/18/2011  Medication Sig Dispense Refill  . albuterol (PROVENTIL HFA;VENTOLIN HFA) 108 (90 BASE) MCG/ACT inhaler Inhale 2 puffs into the lungs every 6 (six) hours as needed.         Marland Kitchen albuterol (PROVENTIL) (2.5 MG/3ML) 0.083% nebulizer solution Take 2.5 mg by nebulization 4 (four) times daily.      Marland Kitchen aspirin 81 MG tablet Take 160 mg by mouth daily.      Marland Kitchen azithromycin (ZITHROMAX) 250 MG tablet Take 250 mg by mouth daily. Until finished      . belladonna-PHENObarbital (DONNATAL) 16.2 MG/5ML ELIX Take 10 mLs (32.4 mg total) by mouth daily as needed for cramping.  120 mL  4  . bisacodyl (DULCOLAX) 5 MG EC tablet Take 5 mg by mouth daily as needed.      . Calcium-Vitamin D-Vitamin K (VIACTIV) 500-100-40 MG-UNT-MCG CHEW Chew 1 tablet by mouth as directed.        . fluticasone (FLONASE) 50 MCG/ACT nasal spray Place 2 sprays into the nose daily.  16 g  3  . Fluticasone-Salmeterol (ADVAIR DISKUS) 100-50 MCG/DOSE AEPB Inhale 1 puff into the lungs 2 (two) times daily.       . furosemide (LASIX) 40 MG tablet Take 1 tablet (40 mg total) by mouth daily.  90 tablet  3  . guaiFENesin (MUCINEX) 600 MG 12 hr tablet Take 1,200 mg by mouth 2 (two) times daily.      Marland Kitchen guaiFENesin (ROBITUSSIN) 100 MG/5ML liquid Take 200 mg by mouth 3 (three) times daily as needed.        Marland Kitchen HYDROcodone-acetaminophen (NORCO) 5-325 MG per tablet 1/2 tablet every 4 hours as needed for pain      . hyoscyamine (LEVSIN SL) 0.125 MG SL tablet Place 0.125 mg under the tongue as needed.        Marland Kitchen ipratropium-albuterol (DUONEB) 0.5-2.5 (3) MG/3ML SOLN Take 3 mLs by nebulization every 4 (four) hours as needed.      Marland Kitchen levothyroxine (SYNTHROID, LEVOTHROID) 88 MCG tablet Take 1 tablet (88 mcg total) by mouth daily.  90 tablet  3  . LORazepam (ATIVAN) 0.5 MG tablet Take 1 tablet (0.5 mg total) by mouth daily as needed for anxiety.  30 tablet  3  . losartan (COZAAR) 50 MG tablet Take 1 tablet (50 mg total) by mouth daily.  90 tablet  3  . lovastatin (MEVACOR) 20 MG tablet Take 1 tablet (20 mg total) by mouth at bedtime.  90 tablet  3  . metoprolol (LOPRESSOR) 50 MG tablet Take 1 tablet (50 mg total) by mouth 2 (two) times daily.   60 tablet  3  . NON FORMULARY Inhale 2 L into the lungs continuous. OXYGEN.      . NON FORMULARY welleese liquid calcium/vit d/magnesium Take 2 tblsp daily       . omeprazole (PRILOSEC) 20 MG capsule Take 20 mg by mouth daily.        . polyethylene glycol (MIRALAX / GLYCOLAX) packet 1/2 pack daily      .  predniSONE (DELTASONE) 10 MG tablet Take 10 mg by mouth daily.      Marland Kitchen tiotropium (SPIRIVA) 18 MCG inhalation capsule Place 36 mcg into inhaler and inhale daily.        Marland Kitchen DISCONTD: Elastic Bandages & Supports (FUTURO FIRM COMPRESSION HOSE) MISC by Does not apply route as directed.        Marland Kitchen DISCONTD: HYDROcodone-acetaminophen (VICODIN) 5-500 MG per tablet Take 1 tablet by mouth as needed.  30 tablet  3  . DISCONTD: meclizine (ANTIVERT) 12.5 MG tablet Take 1 tablet (12.5 mg total) by mouth as needed.  30 tablet  3

## 2011-09-18 NOTE — Patient Instructions (Signed)
Take the prednisone taper and levaquin as prescribed. Increase the dose of the Advair to 250/50 bid Increase your oxygen at rest to 3L/min, 5L/min with exercise. Take delsym as needed for cough We will see you back in 1 month.

## 2011-09-23 ENCOUNTER — Encounter: Payer: Self-pay | Admitting: Internal Medicine

## 2011-10-03 ENCOUNTER — Inpatient Hospital Stay: Payer: Self-pay | Admitting: Internal Medicine

## 2011-10-03 LAB — TROPONIN I: Troponin-I: <0.02 ng/mL

## 2011-10-03 LAB — CBC
HCT: 35.9 % (ref 35.0–47.0)
HGB: 12 g/dL (ref 12.0–16.0)
MCH: 31.2 pg (ref 26.0–34.0)
MCHC: 33.3 g/dL (ref 32.0–36.0)
MCV: 94 fL (ref 80–100)
Platelet: 258 10*3/uL (ref 150–440)
RBC: 3.83 10*6/uL (ref 3.80–5.20)
RDW: 15.8 % — ABNORMAL HIGH (ref 11.5–14.5)
WBC: 14.7 10*3/uL — ABNORMAL HIGH (ref 3.6–11.0)

## 2011-10-03 LAB — COMPREHENSIVE METABOLIC PANEL
Albumin: 2.8 g/dL — ABNORMAL LOW (ref 3.4–5.0)
Alkaline Phosphatase: 69 U/L (ref 50–136)
Anion Gap: 12 (ref 7–16)
BUN: 12 mg/dL (ref 7–18)
Bilirubin,Total: 1.4 mg/dL — ABNORMAL HIGH (ref 0.2–1.0)
Calcium, Total: 8.2 mg/dL — ABNORMAL LOW (ref 8.5–10.1)
Chloride: 84 mmol/L — ABNORMAL LOW (ref 98–107)
Co2: 30 mmol/L (ref 21–32)
Creatinine: 0.79 mg/dL (ref 0.60–1.30)
EGFR (African American): >60
EGFR (Non-African Amer.): >60
Glucose: 157 mg/dL — ABNORMAL HIGH (ref 65–99)
Osmolality: 256 (ref 275–301)
Potassium: 3.3 mmol/L — ABNORMAL LOW (ref 3.5–5.1)
SGOT(AST): 22 U/L (ref 15–37)
SGPT (ALT): 20 U/L
Sodium: 126 mmol/L — ABNORMAL LOW (ref 136–145)
Total Protein: 6.4 g/dL (ref 6.4–8.2)

## 2011-10-03 LAB — CK TOTAL AND CKMB (NOT AT ARMC)
CK, Total: 68 U/L (ref 21–215)
CK-MB: 0.6 ng/mL (ref 0.5–3.6)

## 2011-10-04 LAB — URINALYSIS, COMPLETE
Bilirubin,UR: NEGATIVE
Blood: NEGATIVE
Glucose,UR: NEGATIVE mg/dL (ref 0–75)
Hyaline Cast: 9
Leukocyte Esterase: NEGATIVE
Nitrite: NEGATIVE
Ph: 5 (ref 4.5–8.0)
Protein: 30
RBC,UR: 26 /[HPF] (ref 0–5)
Specific Gravity: 1.021 (ref 1.003–1.030)
Squamous Epithelial: 7
WBC UR: 8 /[HPF] (ref 0–5)

## 2011-10-04 LAB — COMPREHENSIVE METABOLIC PANEL
Albumin: 2.4 g/dL — ABNORMAL LOW (ref 3.4–5.0)
Alkaline Phosphatase: 65 U/L (ref 50–136)
Anion Gap: 12 (ref 7–16)
BUN: 13 mg/dL (ref 7–18)
Bilirubin,Total: 1 mg/dL (ref 0.2–1.0)
Calcium, Total: 8.1 mg/dL — ABNORMAL LOW (ref 8.5–10.1)
Chloride: 86 mmol/L — ABNORMAL LOW (ref 98–107)
Co2: 27 mmol/L (ref 21–32)
Creatinine: 0.58 mg/dL — ABNORMAL LOW (ref 0.60–1.30)
EGFR (African American): >60
EGFR (Non-African Amer.): >60
Glucose: 149 mg/dL — ABNORMAL HIGH (ref 65–99)
Osmolality: 254 (ref 275–301)
Potassium: 3.7 mmol/L (ref 3.5–5.1)
SGOT(AST): 26 U/L (ref 15–37)
SGPT (ALT): 20 U/L
Sodium: 125 mmol/L — ABNORMAL LOW (ref 136–145)
Total Protein: 6 g/dL — ABNORMAL LOW (ref 6.4–8.2)

## 2011-10-04 LAB — CBC WITH DIFFERENTIAL/PLATELET
Basophil #: 0 10*3/uL (ref 0.0–0.1)
Basophil %: 0.1 %
Eosinophil #: 0 10*3/uL (ref 0.0–0.7)
Eosinophil %: 0 %
HCT: 33 % — ABNORMAL LOW (ref 35.0–47.0)
HGB: 11.1 g/dL — ABNORMAL LOW (ref 12.0–16.0)
Lymphocyte %: 5 %
Lymphs Abs: 0.6 10*3/uL — ABNORMAL LOW (ref 1.0–3.6)
MCH: 31.3 pg (ref 26.0–34.0)
MCHC: 33.8 g/dL (ref 32.0–36.0)
MCV: 93 fL (ref 80–100)
Monocyte #: 0.2 10*3/uL (ref 0.0–0.7)
Monocyte %: 1.8 %
Neutrophil #: 11.3 10*3/uL — ABNORMAL HIGH (ref 1.4–6.5)
Neutrophil %: 93.1 %
Platelet: 241 10*3/uL (ref 150–440)
RBC: 3.56 10*6/uL — ABNORMAL LOW (ref 3.80–5.20)
RDW: 16 % — ABNORMAL HIGH (ref 11.5–14.5)
WBC: 12.1 10*3/uL — ABNORMAL HIGH (ref 3.6–11.0)

## 2011-10-04 LAB — HEMOGLOBIN A1C: Hemoglobin A1C: 6.9 % — ABNORMAL HIGH (ref 4.2–6.3)

## 2011-10-05 LAB — BASIC METABOLIC PANEL
Anion Gap: 12 (ref 7–16)
BUN: 10 mg/dL (ref 7–18)
Calcium, Total: 8.1 mg/dL — ABNORMAL LOW (ref 8.5–10.1)
Chloride: 89 mmol/L — ABNORMAL LOW (ref 98–107)
Co2: 27 mmol/L (ref 21–32)
Creatinine: 0.6 mg/dL (ref 0.60–1.30)
EGFR (African American): >60
EGFR (Non-African Amer.): >60
Glucose: 122 mg/dL — ABNORMAL HIGH (ref 65–99)
Osmolality: 257 (ref 275–301)
Potassium: 4 mmol/L (ref 3.5–5.1)
Sodium: 128 mmol/L — ABNORMAL LOW (ref 136–145)

## 2011-10-05 LAB — CBC WITH DIFFERENTIAL/PLATELET
Bands: 11 %
Comment - H1-Com1: NORMAL
Comment - H1-Com2: NORMAL
HCT: 31.9 % — ABNORMAL LOW (ref 35.0–47.0)
HGB: 10.9 g/dL — ABNORMAL LOW (ref 12.0–16.0)
Lymphocytes: 6 %
MCH: 31.8 pg (ref 26.0–34.0)
MCHC: 34.2 g/dL (ref 32.0–36.0)
MCV: 93 fL (ref 80–100)
Metamyelocyte: 2 %
Monocytes: 6 %
Platelet: 223 10*3/uL (ref 150–440)
RBC: 3.43 10*6/uL — ABNORMAL LOW (ref 3.80–5.20)
RDW: 15.8 % — ABNORMAL HIGH (ref 11.5–14.5)
Segmented Neutrophils: 75 %
WBC: 14.2 10*3/uL — ABNORMAL HIGH (ref 3.6–11.0)

## 2011-10-07 LAB — CBC WITH DIFFERENTIAL/PLATELET
Basophil #: 0 10*3/uL (ref 0.0–0.1)
Basophil %: 0.1 %
Eosinophil #: 0 10*3/uL (ref 0.0–0.7)
Eosinophil %: 0 %
HCT: 30.1 % — ABNORMAL LOW (ref 35.0–47.0)
HGB: 10 g/dL — ABNORMAL LOW (ref 12.0–16.0)
Lymphocyte %: 8.2 %
Lymphs Abs: 0.7 10*3/uL — ABNORMAL LOW (ref 1.0–3.6)
MCH: 30.9 pg (ref 26.0–34.0)
MCHC: 33.3 g/dL (ref 32.0–36.0)
MCV: 93 fL (ref 80–100)
Monocyte #: 0.8 10*3/uL — ABNORMAL HIGH (ref 0.0–0.7)
Monocyte %: 9 %
Neutrophil #: 7.2 10*3/uL — ABNORMAL HIGH (ref 1.4–6.5)
Neutrophil %: 82.7 %
Platelet: 227 10*3/uL (ref 150–440)
RBC: 3.25 10*6/uL — ABNORMAL LOW (ref 3.80–5.20)
RDW: 16.1 % — ABNORMAL HIGH (ref 11.5–14.5)
WBC: 8.6 10*3/uL (ref 3.6–11.0)

## 2011-10-07 LAB — BASIC METABOLIC PANEL
Anion Gap: 10 (ref 7–16)
BUN: 9 mg/dL (ref 7–18)
Calcium, Total: 8.3 mg/dL — ABNORMAL LOW (ref 8.5–10.1)
Chloride: 93 mmol/L — ABNORMAL LOW (ref 98–107)
Co2: 27 mmol/L (ref 21–32)
Creatinine: 0.5 mg/dL — ABNORMAL LOW (ref 0.60–1.30)
EGFR (African American): >60
EGFR (Non-African Amer.): >60
Glucose: 154 mg/dL — ABNORMAL HIGH (ref 65–99)
Osmolality: 263 (ref 275–301)
Potassium: 3.9 mmol/L (ref 3.5–5.1)
Sodium: 130 mmol/L — ABNORMAL LOW (ref 136–145)

## 2011-10-08 ENCOUNTER — Institutional Professional Consult (permissible substitution)
Admission: AD | Admit: 2011-10-08 | Discharge: 2011-10-27 | Disposition: A | Discharge status: Hospice | Payer: Self-pay | Source: Ambulatory Visit | Attending: Internal Medicine | Admitting: Internal Medicine

## 2011-10-08 DIAGNOSIS — R0902 Hypoxemia: Secondary | ICD-10-CM

## 2011-10-08 LAB — BASIC METABOLIC PANEL
Anion Gap: 10 (ref 7–16)
BUN: 10 mg/dL (ref 7–18)
Calcium, Total: 8.4 mg/dL — ABNORMAL LOW (ref 8.5–10.1)
Chloride: 91 mmol/L — ABNORMAL LOW (ref 98–107)
Co2: 29 mmol/L (ref 21–32)
Creatinine: 0.46 mg/dL — ABNORMAL LOW (ref 0.60–1.30)
EGFR (African American): >60
EGFR (Non-African Amer.): >60
Glucose: 141 mg/dL — ABNORMAL HIGH (ref 65–99)
Osmolality: 262 (ref 275–301)
Potassium: 3.8 mmol/L (ref 3.5–5.1)
Sodium: 130 mmol/L — ABNORMAL LOW (ref 136–145)

## 2011-10-09 ENCOUNTER — Other Ambulatory Visit (HOSPITAL_COMMUNITY): Payer: Self-pay

## 2011-10-09 DIAGNOSIS — I369 Nonrheumatic tricuspid valve disorder, unspecified: Secondary | ICD-10-CM

## 2011-10-09 LAB — BASIC METABOLIC PANEL
BUN: 13 mg/dL (ref 6–23)
CO2: 29 meq/L (ref 19–32)
Calcium: 9.1 mg/dL (ref 8.4–10.5)
Chloride: 87 mEq/L — ABNORMAL LOW (ref 96–112)
Creatinine, Ser: 0.6 mg/dL (ref 0.50–1.10)
GFR calc Af Amer: >90 mL/min (ref >90)
GFR calc non Af Amer: 81 mL/min — ABNORMAL LOW (ref >90)
Glucose, Bld: 142 mg/dL — ABNORMAL HIGH (ref 70–99)
Potassium: 3.9 meq/L (ref 3.5–5.1)
Sodium: 128 meq/L — ABNORMAL LOW (ref 135–145)

## 2011-10-09 LAB — MAGNESIUM: Magnesium: 2.2 mg/dL (ref 1.5–2.5)

## 2011-10-09 LAB — T4, FREE: Free T4: 1.6 ng/dL (ref 0.80–1.80)

## 2011-10-09 LAB — CBC
HCT: 34.4 % — ABNORMAL LOW (ref 36.0–46.0)
Hemoglobin: 11.6 g/dL — ABNORMAL LOW (ref 12.0–15.0)
MCH: 30.1 pg (ref 26.0–34.0)
MCHC: 33.7 g/dL (ref 30.0–36.0)
MCV: 89.4 fL (ref 78.0–100.0)
Platelets: 255 10*3/uL (ref 150–400)
RBC: 3.85 MIL/uL — ABNORMAL LOW (ref 3.87–5.11)
RDW: 15.6 % — ABNORMAL HIGH (ref 11.5–15.5)
WBC: 15.5 10*3/uL — ABNORMAL HIGH (ref 4.0–10.5)

## 2011-10-09 LAB — CULTURE, BLOOD (SINGLE)

## 2011-10-09 LAB — PRO B NATRIURETIC PEPTIDE: Pro B Natriuretic peptide (BNP): 634.5 pg/mL — ABNORMAL HIGH (ref 0–450)

## 2011-10-09 LAB — TSH: TSH: 0.997 u[IU]/mL (ref 0.350–4.500)

## 2011-10-09 LAB — PHOSPHORUS: Phosphorus: 2.3 mg/dL (ref 2.3–4.6)

## 2011-10-09 NOTE — Progress Notes (Signed)
*  PRELIMINARY RESULTS* Echocardiogram 2D Echocardiogram has been performed.  Glean Salen [facility] 10/09/2011, 12:07 PM

## 2011-10-10 LAB — BASIC METABOLIC PANEL
BUN: 13 mg/dL (ref 6–23)
CO2: 31 meq/L (ref 19–32)
Calcium: 8.7 mg/dL (ref 8.4–10.5)
Chloride: 88 mEq/L — ABNORMAL LOW (ref 96–112)
Creatinine, Ser: 0.62 mg/dL (ref 0.50–1.10)
GFR calc Af Amer: >90 mL/min (ref >90)
GFR calc non Af Amer: 80 mL/min — ABNORMAL LOW (ref >90)
Glucose, Bld: 221 mg/dL — ABNORMAL HIGH (ref 70–99)
Potassium: 4.1 mEq/L (ref 3.5–5.1)
Sodium: 128 meq/L — ABNORMAL LOW (ref 135–145)

## 2011-10-11 LAB — URINALYSIS, ROUTINE W REFLEX MICROSCOPIC
Bilirubin Urine: NEGATIVE
Glucose, UA: NEGATIVE mg/dL
Ketones, ur: NEGATIVE mg/dL
Leukocytes, UA: NEGATIVE
Nitrite: NEGATIVE
Protein, ur: NEGATIVE mg/dL
Specific Gravity, Urine: 1.015 (ref 1.005–1.030)
Urobilinogen, UA: 0.2 mg/dL (ref 0.0–1.0)
pH: 7 (ref 5.0–8.0)

## 2011-10-11 LAB — DIFFERENTIAL
Basophils Absolute: 0 10*3/uL (ref 0.0–0.1)
Basophils Relative: 0 % (ref 0–1)
Eosinophils Absolute: 0 10*3/uL (ref 0.0–0.7)
Eosinophils Relative: 0 % (ref 0–5)
Lymphocytes Relative: 8 % — ABNORMAL LOW (ref 12–46)
Lymphs Abs: 1.5 10*3/uL (ref 0.7–4.0)
Monocytes Absolute: 1.1 10*3/uL — ABNORMAL HIGH (ref 0.1–1.0)
Monocytes Relative: 6 % (ref 3–12)
Neutro Abs: 16.3 10*3/uL — ABNORMAL HIGH (ref 1.7–7.7)
Neutrophils Relative %: 86 % — ABNORMAL HIGH (ref 43–77)

## 2011-10-11 LAB — BASIC METABOLIC PANEL
BUN: 12 mg/dL (ref 6–23)
CO2: 29 mEq/L (ref 19–32)
Calcium: 8.8 mg/dL (ref 8.4–10.5)
Chloride: 89 meq/L — ABNORMAL LOW (ref 96–112)
Creatinine, Ser: 0.54 mg/dL (ref 0.50–1.10)
GFR calc Af Amer: >90 mL/min (ref >90)
GFR calc non Af Amer: 84 mL/min — ABNORMAL LOW (ref >90)
Glucose, Bld: 194 mg/dL — ABNORMAL HIGH (ref 70–99)
Potassium: 3.8 mEq/L (ref 3.5–5.1)
Sodium: 130 mEq/L — ABNORMAL LOW (ref 135–145)

## 2011-10-11 LAB — CBC
HCT: 33.7 % — ABNORMAL LOW (ref 36.0–46.0)
Hemoglobin: 11.5 g/dL — ABNORMAL LOW (ref 12.0–15.0)
MCH: 30 pg (ref 26.0–34.0)
MCHC: 34.1 g/dL (ref 30.0–36.0)
MCV: 88 fL (ref 78.0–100.0)
Platelets: 295 10*3/uL (ref 150–400)
RBC: 3.83 MIL/uL — ABNORMAL LOW (ref 3.87–5.11)
RDW: 16.1 % — ABNORMAL HIGH (ref 11.5–15.5)
WBC: 18.9 10*3/uL — ABNORMAL HIGH (ref 4.0–10.5)

## 2011-10-11 LAB — URINE MICROSCOPIC-ADD ON

## 2011-10-12 LAB — DIFFERENTIAL
Basophils Absolute: 0 10*3/uL (ref 0.0–0.1)
Basophils Relative: 0 % (ref 0–1)
Eosinophils Absolute: 0 10*3/uL (ref 0.0–0.7)
Eosinophils Relative: 0 % (ref 0–5)
Lymphocytes Relative: 6 % — ABNORMAL LOW (ref 12–46)
Lymphs Abs: 0.9 10*3/uL (ref 0.7–4.0)
Monocytes Absolute: 0.9 10*3/uL (ref 0.1–1.0)
Monocytes Relative: 6 % (ref 3–12)
Neutro Abs: 13.8 10*3/uL — ABNORMAL HIGH (ref 1.7–7.7)
Neutrophils Relative %: 88 % — ABNORMAL HIGH (ref 43–77)

## 2011-10-12 LAB — BASIC METABOLIC PANEL
BUN: 15 mg/dL (ref 6–23)
CO2: 33 mEq/L — ABNORMAL HIGH (ref 19–32)
Calcium: 8.6 mg/dL (ref 8.4–10.5)
Chloride: 91 mEq/L — ABNORMAL LOW (ref 96–112)
Creatinine, Ser: 0.6 mg/dL (ref 0.50–1.10)
GFR calc Af Amer: >90 mL/min (ref >90)
GFR calc non Af Amer: 81 mL/min — ABNORMAL LOW (ref >90)
Glucose, Bld: 184 mg/dL — ABNORMAL HIGH (ref 70–99)
Potassium: 3.7 mEq/L (ref 3.5–5.1)
Sodium: 132 mEq/L — ABNORMAL LOW (ref 135–145)

## 2011-10-12 LAB — CBC
HCT: 32.3 % — ABNORMAL LOW (ref 36.0–46.0)
Hemoglobin: 10.9 g/dL — ABNORMAL LOW (ref 12.0–15.0)
MCH: 30.4 pg (ref 26.0–34.0)
MCHC: 33.7 g/dL (ref 30.0–36.0)
MCV: 90.2 fL (ref 78.0–100.0)
Platelets: 306 10*3/uL (ref 150–400)
RBC: 3.58 MIL/uL — ABNORMAL LOW (ref 3.87–5.11)
RDW: 16.6 % — ABNORMAL HIGH (ref 11.5–15.5)
WBC: 15.6 10*3/uL — ABNORMAL HIGH (ref 4.0–10.5)

## 2011-10-12 LAB — URINE CULTURE
Colony Count: 70000
Culture  Setup Time: 201302031715

## 2011-10-12 LAB — PROCALCITONIN: Procalcitonin: <0.1 ng/mL

## 2011-10-12 LAB — PREALBUMIN: Prealbumin: 18 mg/dL (ref 17.0–34.0)

## 2011-10-14 ENCOUNTER — Other Ambulatory Visit (HOSPITAL_COMMUNITY): Payer: Self-pay

## 2011-10-14 DIAGNOSIS — J4489 Other specified chronic obstructive pulmonary disease: Secondary | ICD-10-CM

## 2011-10-14 DIAGNOSIS — J449 Chronic obstructive pulmonary disease, unspecified: Secondary | ICD-10-CM

## 2011-10-14 DIAGNOSIS — R0902 Hypoxemia: Secondary | ICD-10-CM

## 2011-10-14 LAB — RENAL FUNCTION PANEL
Albumin: 2.7 g/dL — ABNORMAL LOW (ref 3.5–5.2)
BUN: 22 mg/dL (ref 6–23)
CO2: 33 mEq/L — ABNORMAL HIGH (ref 19–32)
Calcium: 9 mg/dL (ref 8.4–10.5)
Chloride: 92 meq/L — ABNORMAL LOW (ref 96–112)
Creatinine, Ser: 0.54 mg/dL (ref 0.50–1.10)
GFR calc Af Amer: >90 mL/min (ref >90)
GFR calc non Af Amer: 84 mL/min — ABNORMAL LOW (ref >90)
Glucose, Bld: 159 mg/dL — ABNORMAL HIGH (ref 70–99)
Phosphorus: 3.7 mg/dL (ref 2.3–4.6)
Potassium: 3.1 meq/L — ABNORMAL LOW (ref 3.5–5.1)
Sodium: 135 meq/L (ref 135–145)

## 2011-10-14 LAB — BLOOD GAS, ARTERIAL
Acid-Base Excess: 7.5 mmol/L — ABNORMAL HIGH (ref 0.0–2.0)
Bicarbonate: 30.6 mEq/L — ABNORMAL HIGH (ref 20.0–24.0)
O2 Content: 20 L/min
O2 Saturation: 93.8 %
Patient temperature: 98.6
TCO2: 31.7 mmol/L (ref 0–100)
pCO2 arterial: 36.4 mm[Hg] (ref 35.0–45.0)
pH, Arterial: 7.534 — ABNORMAL HIGH (ref 7.350–7.400)
pO2, Arterial: 62.3 mm[Hg] — ABNORMAL LOW (ref 80.0–100.0)

## 2011-10-14 LAB — DIFFERENTIAL
Basophils Absolute: 0 10*3/uL (ref 0.0–0.1)
Basophils Relative: 0 % (ref 0–1)
Eosinophils Absolute: 0 10*3/uL (ref 0.0–0.7)
Eosinophils Relative: 0 % (ref 0–5)
Lymphocytes Relative: 6 % — ABNORMAL LOW (ref 12–46)
Lymphs Abs: 1.1 10*3/uL (ref 0.7–4.0)
Monocytes Absolute: 1.3 10*3/uL — ABNORMAL HIGH (ref 0.1–1.0)
Monocytes Relative: 8 % (ref 3–12)
Neutro Abs: 14.5 10*3/uL — ABNORMAL HIGH (ref 1.7–7.7)
Neutrophils Relative %: 86 % — ABNORMAL HIGH (ref 43–77)

## 2011-10-14 LAB — CBC
HCT: 34.2 % — ABNORMAL LOW (ref 36.0–46.0)
Hemoglobin: 11.6 g/dL — ABNORMAL LOW (ref 12.0–15.0)
MCH: 30.6 pg (ref 26.0–34.0)
MCHC: 33.9 g/dL (ref 30.0–36.0)
MCV: 90.2 fL (ref 78.0–100.0)
Platelets: 317 10*3/uL (ref 150–400)
RBC: 3.79 MIL/uL — ABNORMAL LOW (ref 3.87–5.11)
RDW: 16.5 % — ABNORMAL HIGH (ref 11.5–15.5)
WBC: 16.9 10*3/uL — ABNORMAL HIGH (ref 4.0–10.5)

## 2011-10-14 LAB — POTASSIUM: Potassium: 3.4 mEq/L — ABNORMAL LOW (ref 3.5–5.1)

## 2011-10-14 NOTE — Consult Note (Signed)
Name: Cathy Russell MRN: [id number] DOB: 23-Jan-1924    LOS: 6  PCCM ADMISSION NOTE  History of Present Illness:  78 y/o F, former heavy smoker (43 years for 2ppd) with PMH of Gold stage IV COPD on 3L / [place] at baseline (followed by Dr. Kendrick Fries in -last seen 09/18/11), pulmonary hypertension, Hypothyroidism, HTN, MVP, chronic back pain, recent admit to [facility] for AECOPD and non-traumatic pneumothorax (medically managed) then d/c to rehab facility. Continued to have worsening of symptoms in rehab and was sent back to [facility].  Tx to [facility] for further care 2/1  Cultures:   Antibiotics: Levaquin 2/1>>>2/7 Clinda 1/31>>>2/6   Tests / Events: 2/1 ECHO>>>Left ventricle: The cavity size was normal. Wall thickness was increased in a pattern of mild LVH. Systolic function was vigorous. The estimated ejection fraction was in the range of 65% to 70%. Doppler parameters are consistent with abnormal left ventricular relaxation (grade 1diastolic dysfunction). Pulmonary arteries: PA peak pressure: 45mm Hg (S).   Past Medical History  Diagnosis Date  . MVP (mitral valve prolapse)   . IBS (irritable bowel syndrome)   . Heart murmur   . COPD (chronic obstructive pulmonary disease)     02 dependent  . Pulmonary hypertension     Past Surgical History  Procedure Date  . Appendectomy   . Tonsillectomy   . Abcess drainage     Bilateral abcess in arms    Prior to Admission medications   Medication Sig Start Date End Date Taking? Authorizing Provider  albuterol (PROVENTIL HFA;VENTOLIN HFA) 108 (90 BASE) MCG/ACT inhaler Inhale 2 puffs into the lungs every 6 (six) hours as needed.      Historical Provider, MD  albuterol (PROVENTIL) (2.5 MG/3ML) 0.083% nebulizer solution Take 2.5 mg by nebulization 4 (four) times daily.    Historical Provider, MD  aspirin 81 MG tablet Take 160 mg by mouth daily.    Historical Provider, MD  azithromycin (ZITHROMAX) 250 MG tablet Take 250 mg by mouth  daily. Until finished    Historical Provider, MD  belladonna-PHENObarbital (DONNATAL) 16.2 MG/5ML ELIX Take 10 mLs (32.4 mg total) by mouth daily as needed for cramping. 07/21/11   Duncan Dull, MD  bisacodyl (DULCOLAX) 5 MG EC tablet Take 5 mg by mouth daily as needed.    Historical Provider, MD  Calcium-Vitamin D-Vitamin K (VIACTIV) 500-100-40 MG-UNT-MCG CHEW Chew 1 tablet by mouth as directed.      Historical Provider, MD  fluticasone (FLONASE) 50 MCG/ACT nasal spray Place 2 sprays into the nose daily. 05/14/11   Duncan Dull, MD  Fluticasone-Salmeterol (ADVAIR DISKUS) 100-50 MCG/DOSE AEPB Inhale 1 puff into the lungs 2 (two) times daily.     Historical Provider, MD  furosemide (LASIX) 40 MG tablet Take 1 tablet (40 mg total) by mouth daily. 08/07/11   Duncan Dull, MD  guaiFENesin (MUCINEX) 600 MG 12 hr tablet Take 1,200 mg by mouth 2 (two) times daily.    Historical Provider, MD  guaiFENesin (ROBITUSSIN) 100 MG/5ML liquid Take 200 mg by mouth 3 (three) times daily as needed.      Historical Provider, MD  HYDROcodone-acetaminophen (NORCO) 5-325 MG per tablet 1/2 tablet every 4 hours as needed for pain    Historical Provider, MD  hyoscyamine (LEVSIN SL) 0.125 MG SL tablet Place 0.125 mg under the tongue as needed.      Historical Provider, MD  ipratropium-albuterol (DUONEB) 0.5-2.5 (3) MG/3ML SOLN Take 3 mLs by nebulization every 4 (four) hours as needed.  Historical Provider, MD  levothyroxine (SYNTHROID, LEVOTHROID) 88 MCG tablet Take 1 tablet (88 mcg total) by mouth daily. 08/07/11   Duncan Dull, MD  LORazepam (ATIVAN) 0.5 MG tablet Take 1 tablet (0.5 mg total) by mouth daily as needed for anxiety. 07/21/11 07/20/12  Duncan Dull, MD  losartan (COZAAR) 50 MG tablet Take 1 tablet (50 mg total) by mouth daily. 08/07/11   Duncan Dull, MD  lovastatin (MEVACOR) 20 MG tablet Take 1 tablet (20 mg total) by mouth at bedtime. 06/09/11   Duncan Dull, MD  metoprolol (LOPRESSOR) 50 MG tablet Take 1  tablet (50 mg total) by mouth 2 (two) times daily. 07/21/11   Duncan Dull, MD  NON FORMULARY Inhale 2 L into the lungs continuous. OXYGEN.    Historical Provider, MD  NON FORMULARY welleese liquid calcium/vit d/magnesium Take 2 tblsp daily     Historical Provider, MD  omeprazole (PRILOSEC) 20 MG capsule Take 20 mg by mouth daily.      Historical Provider, MD  polyethylene glycol (MIRALAX / GLYCOLAX) packet 1/2 pack daily    Historical Provider, MD  predniSONE (DELTASONE) 10 MG tablet Take 10 mg by mouth daily.    Historical Provider, MD  tiotropium (SPIRIVA) 18 MCG inhalation capsule Place 36 mcg into inhaler and inhale daily.      Historical Provider, MD    Allergies Allergies  Allergen Reactions  . Doxycycline Hyclate   . Erythromycin   . Penicillins   . Sulfonamide Derivatives     Family History Family History  Problem Relation Age of Onset  . Asthma Mother   . Emphysema Father     was a smoker  . Rheumatic fever Brother   . Aneurysm Daughter     Social History  reports that she quit smoking about 22 years ago. Her smoking use included Cigarettes. She has a 86 pack-year smoking history. She has never used smokeless tobacco. She reports that she does not drink alcohol or use illicit drugs.  Review Of Systems:  Gen: Denies fever, chills, weight change, fatigue, night sweats HEENT: Denies blurred vision, double vision, hearing loss, tinnitus, sinus congestion, rhinorrhea, sore throat, neck stiffness, dysphagia PULM: Denies hemoptysis.  Indicates shortness of breath, cough, sputum production, decreased activity tolerance.  At baseline, sits in chair at home and plays on the computer.  Minimal physical activity.  CV: Denies chest pain, edema, orthopnea, paroxysmal nocturnal dyspnea, palpitations GI: Denies abdominal pain, nausea, vomiting, diarrhea, hematochezia, melena, constipation, change in bowel habits GU: Denies dysuria, hematuria, polyuria, oliguria, urethral  discharge Endocrine: Denies hot or cold intolerance, polyuria, polyphagia or appetite change Derm: Denies rash, dry skin, scaling or peeling skin change Heme: Denies easy bruising, bleeding, bleeding gums Neuro: Denies headache, numbness, weakness, slurred speech, loss of memory or consciousness  Vital Signs: Reviewed.  O2 at 21L / 50% per [place]  Physical Examination: General: morbidly obese, frail elderly female in NAD Neuro: Awake, alert, oriented, MAE CV: s1s2 distant, regular.  PULM: respirations even/non-labored, lungs bilaterally clear, diminished lower GI: round/soft, bsx4 active, tol PO's Extremities: warm/dry, fragile skin, multiple bruises, LE edema 1-2+ pitting  Labs    CBC  Lab 10/14/11 0626 10/12/11 0730 10/11/11 0750  HGB 11.6* 10.9* 11.5*  HCT 34.2* 32.3* 33.7*  WBC 16.9* 15.6* 18.9*  PLT 317 306 295     BMET  Lab 10/14/11 0605 10/12/11 0730 10/11/11 0750 10/10/11 0650 10/09/11 0620  NA 135 132* 130* 128* 128*  K 3.1* 3.7 -- -- --  CL 92* 91* 89* 88* 87*  CO2 33* 33* 29 31 29   GLUCOSE 159* 184* 194* 221* 142*  BUN 22 15 12 13 13   CREATININE 0.54 0.60 0.54 0.62 0.60  CALCIUM 9.0 8.6 8.8 8.7 9.1  MG -- -- -- -- 2.2  PHOS 3.7 -- -- -- 2.3     Radiology:  Assessment and Plan:  Gold stage IV COPD / O2 dependence / Recent Ptx Assessment:  Respiratory decline in last two months.  Admit to [facility] with spontaneous PTX (20%), AECOPD.  Rx'd with levaquin as outpatient and pred taper.  Pt with increased O2 requirements but in no acute distress.  ECHO with diastolic dysfxn, elevated PA pressures.  Pt on theophylline at baseline ?? Plan: -recommend no further ABG's   >>2/6 ABG 7.53 / pCO2 36 / pO2 62 -O2 to keep saturations 88-90% in setting severe copd -no evidence of bronchospasm on exam-would change IV medrol to PO prednisone with slow taper of 10mg  per 5 days to off -f/u cxr -needs aggressive PT to increase activity tolerance -duonebs Q4 -ECHO with diastolic  dysfunction and elevated PA pressures, control rate when tachy -check theophylline level -lasix to neg balance as able, pcxr and bnp to eval now? -DNR / DNI, has made clear does not want aggressive care -would not offer bipap given pt herself does not want intubation and would only prolong suffering.  If decompensates further, would recommend comfort focused care.  Luckily she is in NO distress, no need NIMV at all  replace K  Last pcxr 2/1 reviewed, chronic int changes, repeat now, re veal volume status and infiltrates / ATX Doppler legs r/o dvt, if pos then spiral chest, chronic venous stasis We are looking for any reversible additional causes to her sats / resp failure  Decreased UOP Assessment: Plan: -eval per Primary SVC Lasix when able    Best practices / Disposition: -->Code Status: DNR / DNI -->DVT Px: lovenox -->GI Px: prilosec -->Diet: PO  Matyas Baisley J. Tyson Alias, MD, FACP Pgr: [phone] [facility]  I have fully examined this patient and agree with above findings.    Including editing of this note.  Canary Brim, NP-C  Pulmonary & Critical Care Pgr: [phone]  10/14/2011, 2:50 PM

## 2011-10-15 DIAGNOSIS — R609 Edema, unspecified: Secondary | ICD-10-CM

## 2011-10-15 DIAGNOSIS — R0602 Shortness of breath: Secondary | ICD-10-CM

## 2011-10-15 LAB — BASIC METABOLIC PANEL
BUN: 28 mg/dL — ABNORMAL HIGH (ref 6–23)
CO2: 31 mEq/L (ref 19–32)
Calcium: 8.7 mg/dL (ref 8.4–10.5)
Chloride: 94 meq/L — ABNORMAL LOW (ref 96–112)
Creatinine, Ser: 0.61 mg/dL (ref 0.50–1.10)
GFR calc Af Amer: >90 mL/min (ref >90)
GFR calc non Af Amer: 80 mL/min — ABNORMAL LOW (ref >90)
Glucose, Bld: 108 mg/dL — ABNORMAL HIGH (ref 70–99)
Potassium: 4.3 meq/L (ref 3.5–5.1)
Sodium: 133 mEq/L — ABNORMAL LOW (ref 135–145)

## 2011-10-15 LAB — THEOPHYLLINE LEVEL: Theophylline Lvl: <0.5 ug/mL — ABNORMAL LOW (ref 10.0–20.0)

## 2011-10-15 LAB — PRO B NATRIURETIC PEPTIDE: Pro B Natriuretic peptide (BNP): 300 pg/mL (ref 0–450)

## 2011-10-15 NOTE — Progress Notes (Signed)
VASCULAR LAB PRELIMINARY  PRELIMINARY  PRELIMINARY  PRELIMINARY  Bilateral lower extremity venous Dopplers completed.    Preliminary report:  There is no DVT or SVT noted in the bilateral lower extremities.  Sherren Kerns [place], 10/15/2011, 9:41 AM

## 2011-10-16 DIAGNOSIS — R0902 Hypoxemia: Secondary | ICD-10-CM

## 2011-10-16 DIAGNOSIS — J4489 Other specified chronic obstructive pulmonary disease: Secondary | ICD-10-CM

## 2011-10-16 DIAGNOSIS — J449 Chronic obstructive pulmonary disease, unspecified: Secondary | ICD-10-CM

## 2011-10-16 NOTE — Progress Notes (Signed)
Name: Cathy Russell MRN: [id number] DOB: 12/25/1923    LOS: 8  PCCM FOLLOW-UP NOTE  History of Present Illness:  78 y/o F, former heavy smoker (43 years for 2ppd) with PMH of Gold stage IV COPD on 3L / [place] at baseline (followed by Dr. Kendrick Fries in [place]-last seen 09/18/11), pulmonary hypertension, Hypothyroidism, HTN, MVP, chronic back pain, recent admit to [facility] for AECOPD and non-traumatic pneumothorax (medically managed) then d/c to rehab facility. Continued to have worsening of symptoms in rehab and was sent back to [facility].  Tx to [facility] for further care 2/1  Cultures:   Antibiotics: Levaquin 2/1>>>2/7 Clinda 1/31>>>2/8   Tests / Events: 2/1 ECHO>>> Mild LVH. LVEF 65%. Grade 1diastolic dysfunction. PA peak pressure: 45mm Hg (S).   Vital Signs: Reviewed.  O2 at 21L / 50% per [place]  Physical Examination: General: morbidly obese, frail elderly female in NAD Neuro: Awake, alert, oriented, MAE CV: s1s2 distant, regular.  PULM: respirations even/non-labored, lungs bilaterally clear, diminished lower GI: round/soft, bsx4 active, tol PO's Extremities: warm/dry, fragile skin, multiple bruises, LE edema 1-2+ pitting  Labs    CBC  Lab 10/14/11 0626 10/12/11 0730 10/11/11 0750  HGB 11.6* 10.9* 11.5*  HCT 34.2* 32.3* 33.7*  WBC 16.9* 15.6* 18.9*  PLT 317 306 295     BMET  Lab 10/15/11 0555 10/14/11 2028 10/14/11 0605 10/12/11 0730 10/11/11 0750 10/10/11 0650  NA 133* -- 135 132* 130* 128*  K 4.3 3.4* -- -- -- --  CL 94* -- 92* 91* 89* 88*  CO2 31 -- 33* 33* 29 31  GLUCOSE 108* -- 159* 184* 194* 221*  BUN 28* -- 22 15 12 13   CREATININE 0.61 -- 0.54 0.60 0.54 0.62  CALCIUM 8.7 -- 9.0 8.6 8.8 8.7  MG -- -- -- -- -- --  PHOS -- -- 3.7 -- -- --     Radiology: CXR from 2/6 reviewed  Assessment and Plan:  Gold stage IV COPD / O2 dependence / Recent Ptx/secondary pulmonary htn/refractory hypoxemia Assessment: Still wheezing Plan: -O2 to keep saturations > 90%  in setting severe copd -Cont systemic steroids - OK to transition to enteral in next day or two -Consider addition of nebulized steroids as systemic steroids tapered -Cont BDs  -Ca2+ channel blockers could theoretically worsen gas exchange by disrupting V/Q matching. We could consider discontinuing diltiazem and seeing if O2 reqt's improve  Other issues: Discussed with Dr Christella Hartigan - doubt active bacterial process and no evidence of PNA on CXR. Would D/C clindamycin I doubt she is deriving any benefit from statin therapy - would D/C    Best practices / Disposition: -->Code Status: DNR / DNI -->DVT Px: lovenox -->GI Px: prilosec  Billy Fischer, MD;  PCCM service; Mobile [phone]   10/16/2011, 12:38 PM

## 2011-10-17 LAB — CBC
HCT: 33.4 % — ABNORMAL LOW (ref 36.0–46.0)
Hemoglobin: 11.2 g/dL — ABNORMAL LOW (ref 12.0–15.0)
MCH: 30.1 pg (ref 26.0–34.0)
MCHC: 33.5 g/dL (ref 30.0–36.0)
MCV: 89.8 fL (ref 78.0–100.0)
Platelets: 274 10*3/uL (ref 150–400)
RBC: 3.72 MIL/uL — ABNORMAL LOW (ref 3.87–5.11)
RDW: 16.8 % — ABNORMAL HIGH (ref 11.5–15.5)
WBC: 11.8 10*3/uL — ABNORMAL HIGH (ref 4.0–10.5)

## 2011-10-17 LAB — BASIC METABOLIC PANEL
BUN: 21 mg/dL (ref 6–23)
CO2: 34 mEq/L — ABNORMAL HIGH (ref 19–32)
Calcium: 8.8 mg/dL (ref 8.4–10.5)
Chloride: 91 mEq/L — ABNORMAL LOW (ref 96–112)
Creatinine, Ser: 0.61 mg/dL (ref 0.50–1.10)
GFR calc Af Amer: >90 mL/min (ref >90)
GFR calc non Af Amer: 80 mL/min — ABNORMAL LOW (ref >90)
Glucose, Bld: 192 mg/dL — ABNORMAL HIGH (ref 70–99)
Potassium: 4.3 mEq/L (ref 3.5–5.1)
Sodium: 130 mEq/L — ABNORMAL LOW (ref 135–145)

## 2011-10-18 LAB — CBC
HCT: 32.5 % — ABNORMAL LOW (ref 36.0–46.0)
Hemoglobin: 10.9 g/dL — ABNORMAL LOW (ref 12.0–15.0)
MCH: 30.3 pg (ref 26.0–34.0)
MCHC: 33.5 g/dL (ref 30.0–36.0)
MCV: 90.3 fL (ref 78.0–100.0)
Platelets: 277 10*3/uL (ref 150–400)
RBC: 3.6 MIL/uL — ABNORMAL LOW (ref 3.87–5.11)
RDW: 16.6 % — ABNORMAL HIGH (ref 11.5–15.5)
WBC: 9 10*3/uL (ref 4.0–10.5)

## 2011-10-18 LAB — BASIC METABOLIC PANEL
BUN: 21 mg/dL (ref 6–23)
CO2: 31 mEq/L (ref 19–32)
Calcium: 8.7 mg/dL (ref 8.4–10.5)
Chloride: 90 meq/L — ABNORMAL LOW (ref 96–112)
Creatinine, Ser: 0.63 mg/dL (ref 0.50–1.10)
GFR calc Af Amer: >90 mL/min (ref >90)
GFR calc non Af Amer: 80 mL/min — ABNORMAL LOW (ref >90)
Glucose, Bld: 167 mg/dL — ABNORMAL HIGH (ref 70–99)
Potassium: 4 mEq/L (ref 3.5–5.1)
Sodium: 129 mEq/L — ABNORMAL LOW (ref 135–145)

## 2011-10-18 LAB — MAGNESIUM: Magnesium: 2.4 mg/dL (ref 1.5–2.5)

## 2011-10-19 DIAGNOSIS — R0902 Hypoxemia: Secondary | ICD-10-CM

## 2011-10-19 DIAGNOSIS — J4489 Other specified chronic obstructive pulmonary disease: Secondary | ICD-10-CM

## 2011-10-19 DIAGNOSIS — J449 Chronic obstructive pulmonary disease, unspecified: Secondary | ICD-10-CM

## 2011-10-19 LAB — BASIC METABOLIC PANEL
BUN: 19 mg/dL (ref 6–23)
CO2: 28 meq/L (ref 19–32)
Calcium: 8.5 mg/dL (ref 8.4–10.5)
Chloride: 89 mEq/L — ABNORMAL LOW (ref 96–112)
Creatinine, Ser: 0.54 mg/dL (ref 0.50–1.10)
GFR calc Af Amer: >90 mL/min (ref >90)
GFR calc non Af Amer: 84 mL/min — ABNORMAL LOW (ref >90)
Glucose, Bld: 161 mg/dL — ABNORMAL HIGH (ref 70–99)
Potassium: 3.7 meq/L (ref 3.5–5.1)
Sodium: 130 meq/L — ABNORMAL LOW (ref 135–145)

## 2011-10-19 LAB — T4, FREE: Free T4: 1.14 ng/dL (ref 0.80–1.80)

## 2011-10-19 LAB — TSH: TSH: 2.433 u[IU]/mL (ref 0.350–4.500)

## 2011-10-19 NOTE — Progress Notes (Signed)
Name: Cathy Russell MRN: [id number] DOB: 1923-09-17    LOS: 11  PCCM FOLLOW-UP NOTE  History of Present Illness:  78 y/o F, former heavy smoker (43 years for 2ppd) with PMH of Gold stage IV COPD on 3L / [name] at baseline (followed by Dr. Kendrick Fries in [place]-last seen 09/18/11), pulmonary hypertension, Hypothyroidism, HTN, MVP, chronic back pain, recent admit to [facility] for AECOPD and non-traumatic pneumothorax (medically managed) then d/c to rehab facility. Continued to have worsening of symptoms in rehab and was sent back to [facility].  Tx to [facility] for further care 2/1  Antibiotics: Levaquin 2/1>>>2/7 Clinda 1/31>>>2/8  Tests / Events: 2/1 ECHO>>> Mild LVH. LVEF 65%. Grade 1diastolic dysfunction. PA peak pressure: 45mm Hg (S).  Subjective: C/o sore throat.  Denies chest pain.  Vital Signs: Reviewed.  Physical Examination: General: morbidly obese, frail elderly female in NAD Neuro: Awake, alert, oriented, MAE CV: s1s2 distant, regular.  PULM: respirations even/non-labored, lungs bilaterally clear, diminished lower GI: round/soft, bsx4 active, tol PO's Extremities: warm/dry, fragile skin, multiple bruises, LE edema 1-2+ pitting  Labs    CBC  Lab 10/18/11 0605 10/17/11 0600 10/14/11 0626  HGB 10.9* 11.2* 11.6*  HCT 32.5* 33.4* 34.2*  WBC 9.0 11.8* 16.9*  PLT 277 274 317     BMET  Lab 10/19/11 0700 10/18/11 0605 10/17/11 0600 10/15/11 0555 10/14/11 0605  NA 130* 129* 130* 133* 135  K 3.7 4.0 -- -- --  CL 89* 90* 91* 94* 92*  CO2 28 31 34* 31 33*  GLUCOSE 161* 167* 192* 108* 159*  BUN 19 21 21  28* 22  CREATININE 0.54 0.63 0.61 0.61 0.54  CALCIUM 8.5 8.7 8.8 8.7 9.0  MG -- 2.4 -- -- --  PHOS -- -- -- -- 3.7     Radiology: CXR from 2/6 reviewed  Assessment and Plan:  Gold stage IV COPD / O2 dependence / Recent Ptx/secondary pulmonary htn/refractory hypoxemia Assessment: Still wheezing Plan: -O2 to keep saturations > 90% in setting severe copd -wean  steroids as tolerated -Consider addition of nebulized steroids as systemic steroids tapered -Continue BDs   -->Code Status: DNR / DNI  Will check back Thursday Feb 14>>call if help needed sooner.  _______________________ Coralyn Helling, MD 10/19/2011, 11:03 AM Pager:  [phone]

## 2011-10-21 LAB — BASIC METABOLIC PANEL
BUN: 16 mg/dL (ref 6–23)
CO2: 31 meq/L (ref 19–32)
Calcium: 8 mg/dL — ABNORMAL LOW (ref 8.4–10.5)
Chloride: 92 meq/L — ABNORMAL LOW (ref 96–112)
Creatinine, Ser: 0.57 mg/dL (ref 0.50–1.10)
GFR calc Af Amer: >90 mL/min (ref >90)
GFR calc non Af Amer: 82 mL/min — ABNORMAL LOW (ref >90)
Glucose, Bld: 87 mg/dL (ref 70–99)
Potassium: 3.5 mEq/L (ref 3.5–5.1)
Sodium: 130 meq/L — ABNORMAL LOW (ref 135–145)

## 2011-10-21 LAB — DIFFERENTIAL
Basophils Absolute: 0 10*3/uL (ref 0.0–0.1)
Basophils Relative: 0 % (ref 0–1)
Eosinophils Absolute: 0.1 10*3/uL (ref 0.0–0.7)
Eosinophils Relative: 1 % (ref 0–5)
Lymphocytes Relative: 11 % — ABNORMAL LOW (ref 12–46)
Lymphs Abs: 1.4 10*3/uL (ref 0.7–4.0)
Monocytes Absolute: 0.6 10*3/uL (ref 0.1–1.0)
Monocytes Relative: 5 % (ref 3–12)
Neutro Abs: 11.1 10*3/uL — ABNORMAL HIGH (ref 1.7–7.7)
Neutrophils Relative %: 84 % — ABNORMAL HIGH (ref 43–77)

## 2011-10-21 LAB — CBC
HCT: 29.6 % — ABNORMAL LOW (ref 36.0–46.0)
Hemoglobin: 10.3 g/dL — ABNORMAL LOW (ref 12.0–15.0)
MCH: 30.9 pg (ref 26.0–34.0)
MCHC: 34.8 g/dL (ref 30.0–36.0)
MCV: 88.9 fL (ref 78.0–100.0)
Platelets: 209 10*3/uL (ref 150–400)
RBC: 3.33 MIL/uL — ABNORMAL LOW (ref 3.87–5.11)
RDW: 16.7 % — ABNORMAL HIGH (ref 11.5–15.5)
WBC: 13.2 10*3/uL — ABNORMAL HIGH (ref 4.0–10.5)

## 2011-10-22 ENCOUNTER — Other Ambulatory Visit: Payer: Self-pay | Admitting: *Deleted

## 2011-10-22 DIAGNOSIS — R109 Unspecified abdominal pain: Secondary | ICD-10-CM

## 2011-10-22 NOTE — Progress Notes (Signed)
Name: Cathy Russell MRN: [id number] DOB: 1924-03-11    LOS: 14  PCCM FOLLOW-UP NOTE  History of Present Illness:  78 y/o F, former heavy smoker (43 years for 2ppd) with PMH of Gold stage IV COPD on 3L /  at baseline (followed by Dr. Kendrick Fries in [place]-last seen 09/18/11), pulmonary hypertension, Hypothyroidism, HTN, MVP, chronic back pain, recent admit to [facility] for AECOPD and non-traumatic pneumothorax (medically managed) then d/c to rehab facility. Continued to have worsening of symptoms in rehab and was sent back to [facility].  Tx to [facility] for further care 2/1  Antibiotics: Levaquin 2/1>>>2/7 Clinda 1/31>>>2/8  Tests / Events: 2/1 ECHO>>> Mild LVH. LVEF 65%. Grade 1diastolic dysfunction. PA peak pressure: 45mm Hg (S).  Subjective: Decreased cough, wheeze, sputum.  Still on increased FiO2 with vapotherm  Vital Signs: Reviewed.  Physical Examination: General: morbidly obese, frail elderly female in NAD Neuro: Awake, alert, oriented, MAE CV: s1s2 distant, regular.  PULM: respirations even/non-labored, lungs bilaterally clear, diminished lower GI: round/soft, bsx4 active, tol PO's Extremities: warm/dry, fragile skin, multiple bruises, LE edema 1-2+ pitting  Labs    CBC  Lab 10/21/11 0553 10/18/11 0605 10/17/11 0600  HGB 10.3* 10.9* 11.2*  HCT 29.6* 32.5* 33.4*  WBC 13.2* 9.0 11.8*  PLT 209 277 274     BMET  Lab 10/21/11 0553 10/19/11 0700 10/18/11 0605 10/17/11 0600  NA 130* 130* 129* 130*  K 3.5 3.7 -- --  CL 92* 89* 90* 91*  CO2 31 28 31  34*  GLUCOSE 87 161* 167* 192*  BUN 16 19 21 21   CREATININE 0.57 0.54 0.63 0.61  CALCIUM 8.0* 8.5 8.7 8.8  MG -- -- 2.4 --  PHOS -- -- -- --     Radiology: CXR from 2/6 reviewed  Assessment and Plan:  Gold 4 COPD with chronic hypoxic respiratory failure -O2 to keep saturations > 90% in setting severe copd -wean steroids as tolerated -Consider addition of nebulized steroids as systemic steroids  tapered -Continue BDs   -->Code Status: DNR / DNI  Arrangements being made for outpt hospice care.  Will check back Monday Feb 18>>call if help needed sooner.  _______________________ Coralyn Helling, MD 10/22/2011, 10:13 AM Pager:  [phone]

## 2011-10-22 NOTE — Telephone Encounter (Signed)
Faxed request from [facility], last filled 06/10/11.

## 2011-10-23 LAB — BASIC METABOLIC PANEL
BUN: 10 mg/dL (ref 6–23)
CO2: 29 meq/L (ref 19–32)
Calcium: 8.1 mg/dL — ABNORMAL LOW (ref 8.4–10.5)
Chloride: 93 meq/L — ABNORMAL LOW (ref 96–112)
Creatinine, Ser: 0.48 mg/dL — ABNORMAL LOW (ref 0.50–1.10)
GFR calc Af Amer: >90 mL/min (ref >90)
GFR calc non Af Amer: 87 mL/min — ABNORMAL LOW (ref >90)
Glucose, Bld: 121 mg/dL — ABNORMAL HIGH (ref 70–99)
Potassium: 3.4 meq/L — ABNORMAL LOW (ref 3.5–5.1)
Sodium: 130 meq/L — ABNORMAL LOW (ref 135–145)

## 2011-10-23 LAB — CBC
HCT: 28.1 % — ABNORMAL LOW (ref 36.0–46.0)
Hemoglobin: 9.5 g/dL — ABNORMAL LOW (ref 12.0–15.0)
MCH: 30.3 pg (ref 26.0–34.0)
MCHC: 33.8 g/dL (ref 30.0–36.0)
MCV: 89.5 fL (ref 78.0–100.0)
Platelets: 194 10*3/uL (ref 150–400)
RBC: 3.14 MIL/uL — ABNORMAL LOW (ref 3.87–5.11)
RDW: 16.8 % — ABNORMAL HIGH (ref 11.5–15.5)
WBC: 12.3 10*3/uL — ABNORMAL HIGH (ref 4.0–10.5)

## 2011-10-23 MED ORDER — BELLADONNA ALK-PHENOBARBITAL 16.2 MG/5ML PO ELIX: 5.0000 mL | ORAL_SOLUTION | Freq: Three times a day (TID) | ORAL | Status: AC | PRN

## 2011-10-23 NOTE — Telephone Encounter (Signed)
Medicine called to pharmacy. 

## 2011-10-24 LAB — POTASSIUM: Potassium: 4.7 mEq/L (ref 3.5–5.1)

## 2011-10-26 DIAGNOSIS — J449 Chronic obstructive pulmonary disease, unspecified: Secondary | ICD-10-CM

## 2011-10-26 DIAGNOSIS — J4489 Other specified chronic obstructive pulmonary disease: Secondary | ICD-10-CM

## 2011-10-26 DIAGNOSIS — R0902 Hypoxemia: Secondary | ICD-10-CM

## 2011-10-26 LAB — BASIC METABOLIC PANEL
BUN: 15 mg/dL (ref 6–23)
CO2: 29 meq/L (ref 19–32)
Calcium: 8.9 mg/dL (ref 8.4–10.5)
Chloride: 91 meq/L — ABNORMAL LOW (ref 96–112)
Creatinine, Ser: 0.56 mg/dL (ref 0.50–1.10)
GFR calc Af Amer: >90 mL/min (ref >90)
GFR calc non Af Amer: 83 mL/min — ABNORMAL LOW (ref >90)
Glucose, Bld: 151 mg/dL — ABNORMAL HIGH (ref 70–99)
Potassium: 4 meq/L (ref 3.5–5.1)
Sodium: 130 mEq/L — ABNORMAL LOW (ref 135–145)

## 2011-10-26 NOTE — Progress Notes (Signed)
Name: Cathy Russell MRN: [id number] DOB: 1924-04-27    LOS: 18  PCCM FOLLOW-UP NOTE  History of Present Illness:  77 y/o F, former heavy smoker (43 years for 2ppd) with PMH of Gold stage IV COPD on 3L / [place] at baseline (followed by Dr. Kendrick Fries in [place]-last seen 09/18/11), pulmonary hypertension, Hypothyroidism, HTN, MVP, chronic back pain, recent admit to [facility] for AECOPD and non-traumatic pneumothorax (medically managed) then d/c to rehab facility. Continued to have worsening of symptoms in rehab and was sent back to [facility].  Tx to [facility] for further care 2/1  Antibiotics: Levaquin 2/1>>>2/7 Clinda 1/31>>>2/8  Tests / Events: 2/1 ECHO>>> Mild LVH. LVEF 65%. Grade 1diastolic dysfunction. PA peak pressure: 45mm Hg (S).  Subjective: Still on increased FiO2 with Vapotherm but weaning.  Feels much better.    Vital Signs: Reviewed.  Physical Examination: General: morbidly obese, chronically ill appearing elderly female in NAD Neuro: Awake, alert, oriented, MAE CV: s1s2 distant, regular.  PULM: respirations even/non-labored, lungs bilaterally clear, diminished lower GI: round/soft, bsx4 active, tol PO's Extremities: warm/dry, fragile skin, multiple bruises, LE edema 1-2+ pitting  Labs    CBC  Lab 10/23/11 1140 10/21/11 0553  HGB 9.5* 10.3*  HCT 28.1* 29.6*  WBC 12.3* 13.2*  PLT 194 209     BMET  Lab 10/24/11 0600 10/23/11 0600 10/21/11 0553  NA -- 130* 130*  K 4.7 3.4* --  CL -- 93* 92*  CO2 -- 29 31  GLUCOSE -- 121* 87  BUN -- 10 16  CREATININE -- 0.48* 0.57  CALCIUM -- 8.1* 8.0*  MG -- -- --  PHOS -- -- --     Radiology: No new CXR  Assessment and Plan:  Gold 4 COPD with chronic hypoxic respiratory failure. Requiring high flow O2 with Vapotherm  PLAN -  -cont wean O2 as able to keep sats 88-92% -wean steroids as tolerated -Consider addition of nebulized steroids as systemic steroids tapered -Continue BDs  -int f/u CXR  -outpt pulm f/u -->  sees Dr. Henrene Pastor in [place] Doubt can have high flow O2 at home hospice, may need to titrate to 6 lit and trial this pre dc  -->Code Status: DNR / DNI  Arrangements being made for outpt hospice care. PCCM will f/u again 2/21 please call sooner if needed   [facility], NP 10/26/2011  10:34 AM Pager: [phone]  *Care during the described time interval was provided by me and/or other providers on the critical care team. I have reviewed this patient's available data, including medical history, events of note, physical examination and test results as part of my evaluation.  Mcarthur Rossetti. Tyson Alias, MD, FACP Pgr: [phone] [facility]

## 2011-11-02 ENCOUNTER — Ambulatory Visit: Payer: Medicare Other | Admitting: Pulmonary Disease

## 2013-02-01 IMAGING — US US EXTREM LOW VENOUS*R*
1 series · 17 of 24 positions shown · non-contrast
Comparison: none

REASON FOR EXAM: pain swelling x 1 month in sedentary patient
COMMENTS:

[Series 1: us extrem low venous*right* · 17 of 27 slices shown]
[im 1/27]
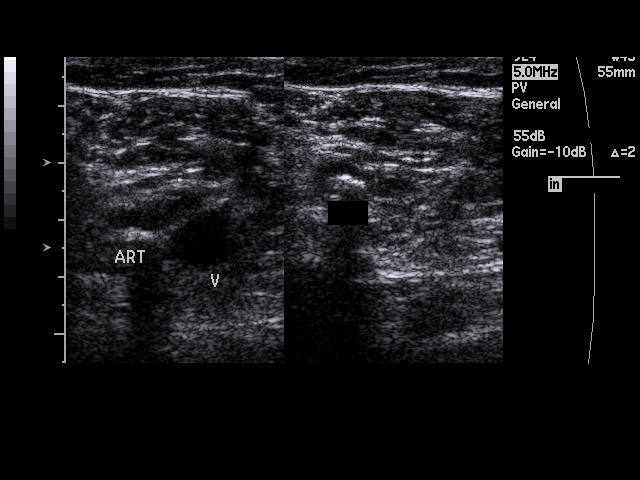
[im 3/27]
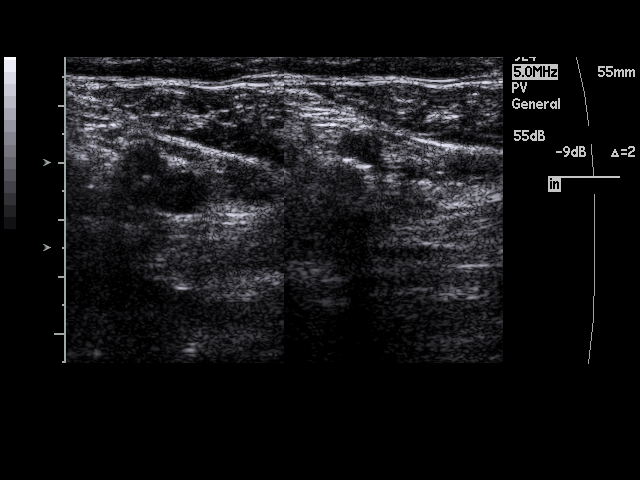
[im 4/27]
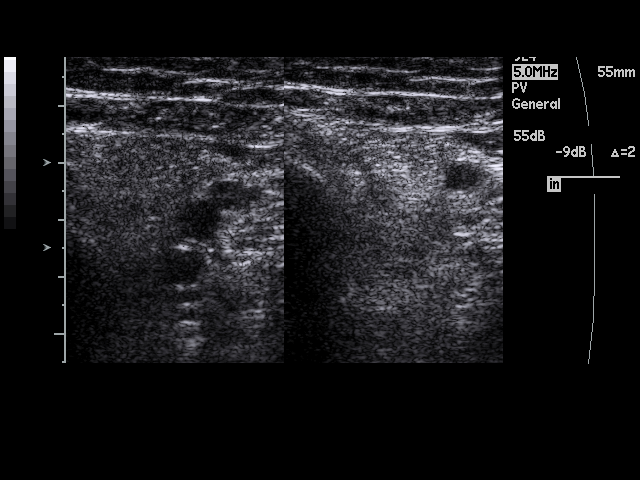
[im 5/27]
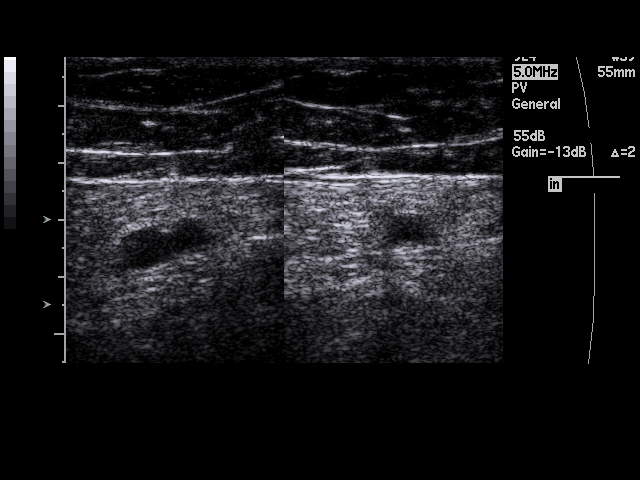
[im 7/27]
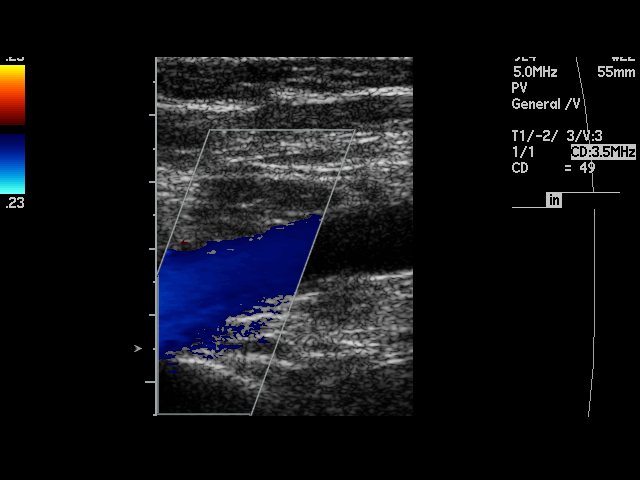
[im 8/27]
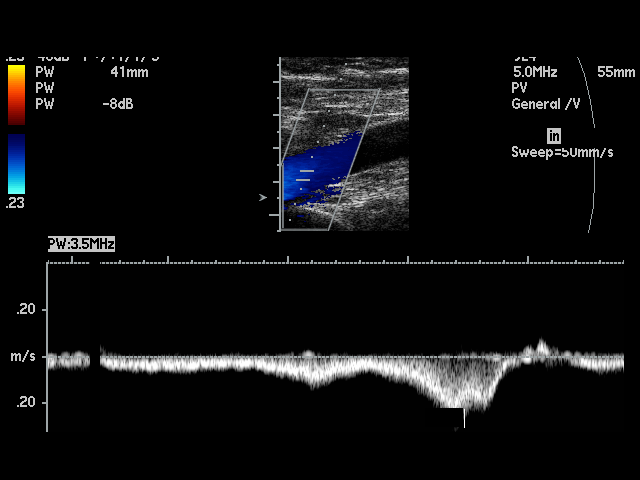
[im 11/27]
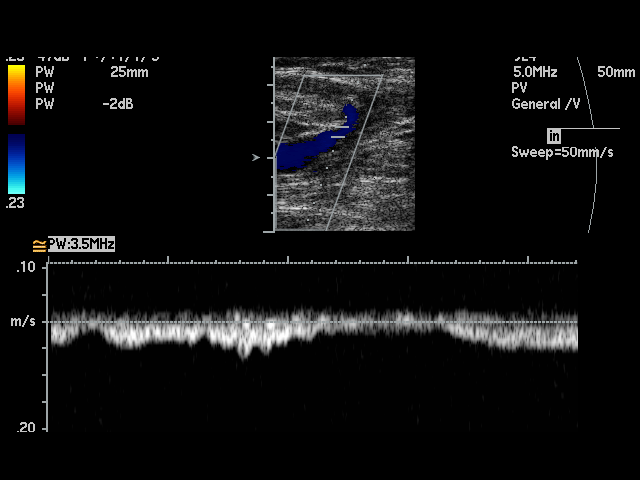
[im 12/27]
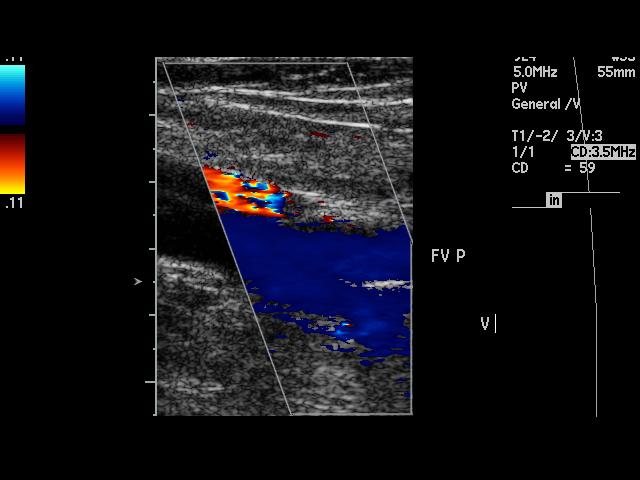
[im 14/27]
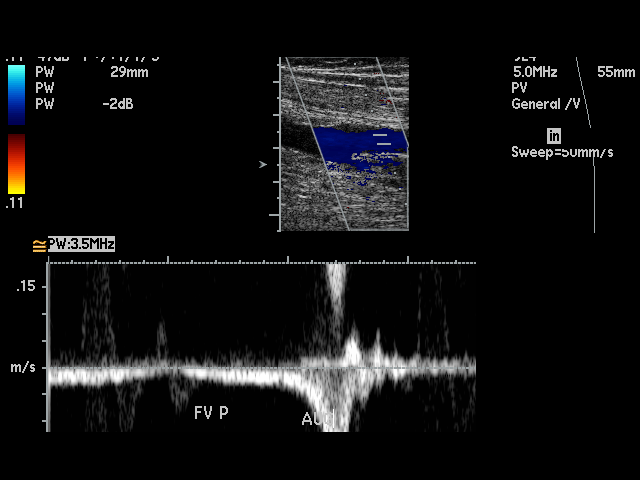
[im 15/27]
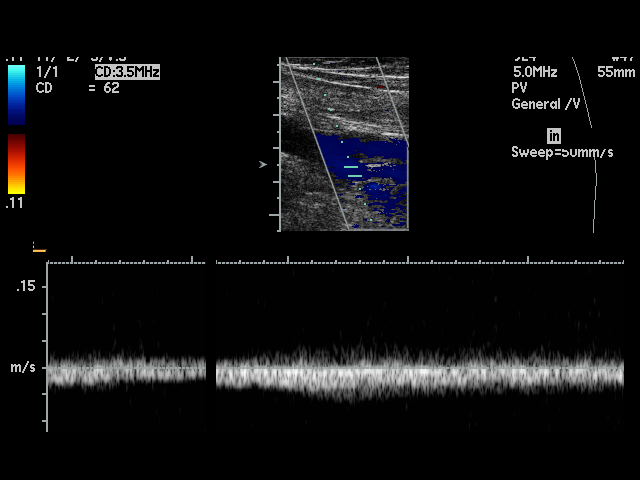
[im 16/27]
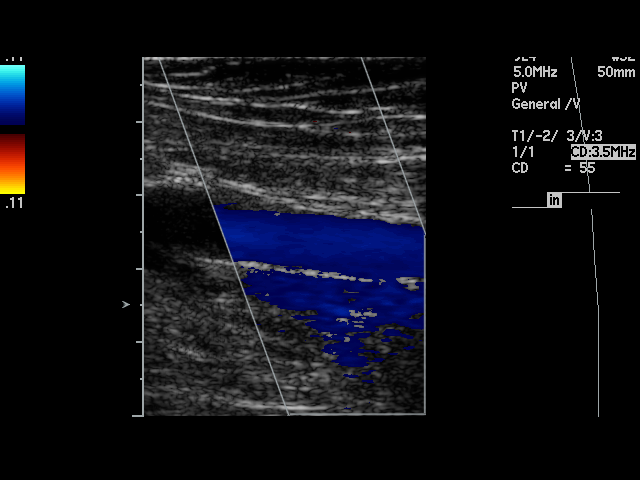
[im 19/27]
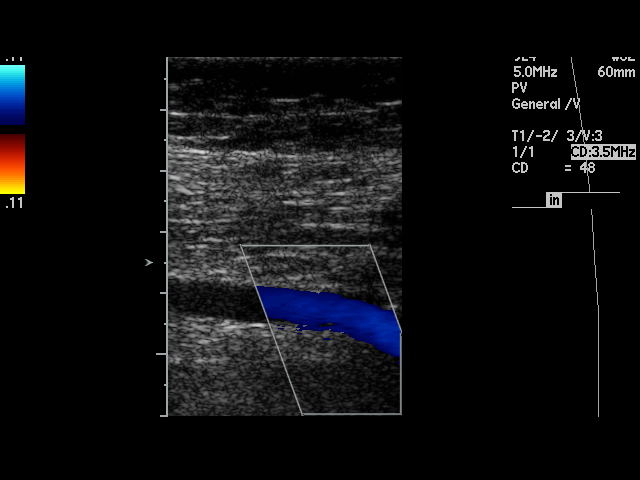
[im 20/27]
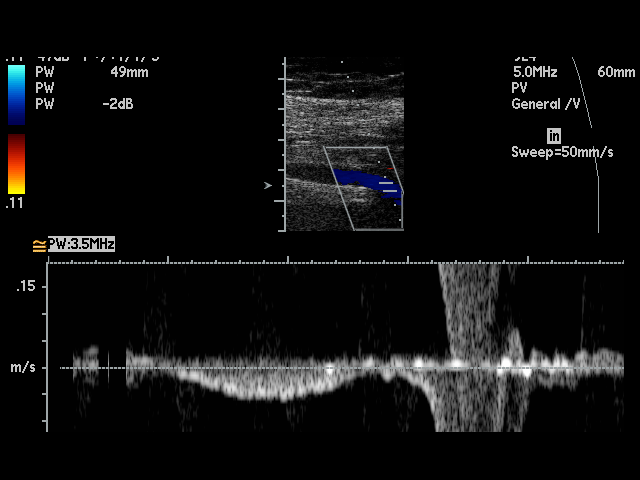
[im 22/27]
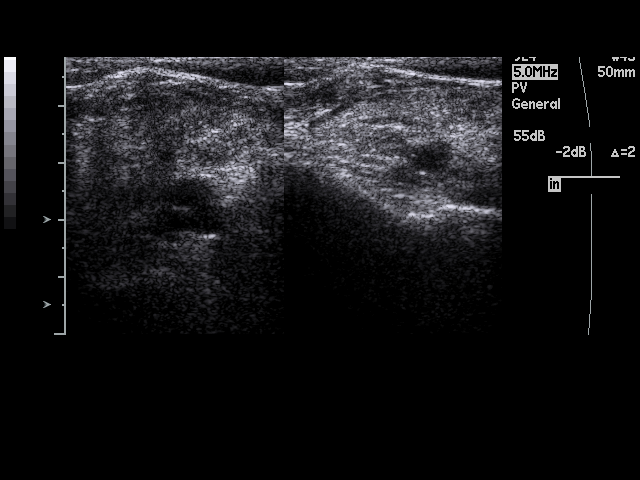
[im 23/27]
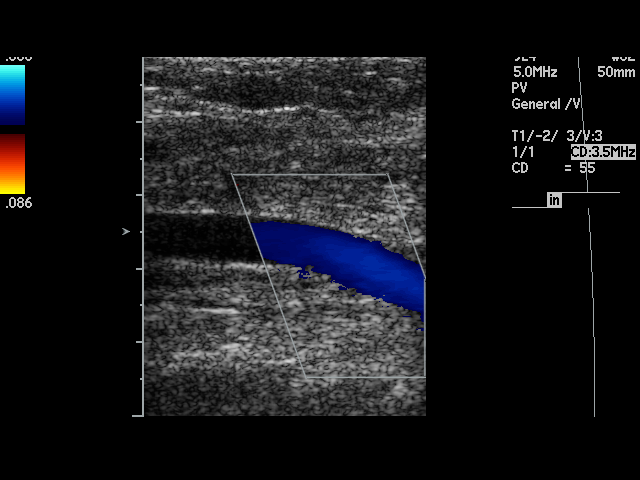
[im 24/27]
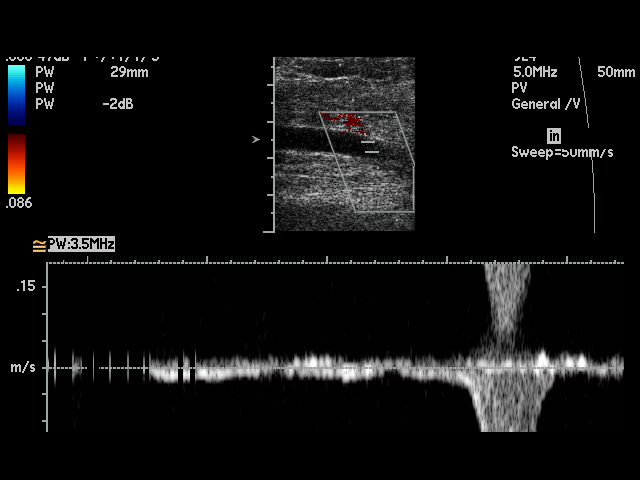
[im 27/27]
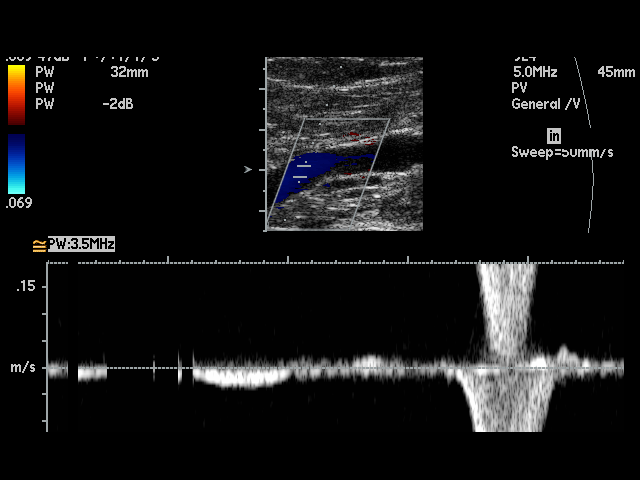

[17 of 24 positions shown; findings below may reference images not displayed]

PROCEDURE:     DUDY - DUDY DOPPLER VEINS RT LEG EXTR  - July 24, 2011 [DATE]

RESULT:     The phasic, augmentation and Valsalva flow waveforms are normal
in appearance. The right femoral and popliteal vein shows complete
compressibility throughout its course. Doppler examination shows no
occlusion or evidence for deep vein thrombosis.
IMPRESSION: 1.     No deep vein thrombosis is identified in the right leg.

## 2013-03-13 IMAGING — CT CT CHEST W/ CM
1 series · 15 of 33 positions shown, 19 images · IV contrast (APPLIED)
Comparison: none

REASON FOR EXAM: low  o2
COMMENTS:

[Series 4: soft tissue · axial · 0.74mm/px · z∈[-611,-356]mm · 15 of 101 slices shown, 19 images]
[im 8/101  mediastinal]
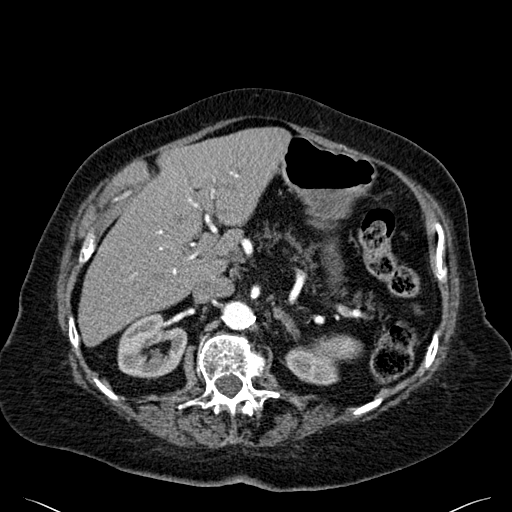
[im 8/101  lung]
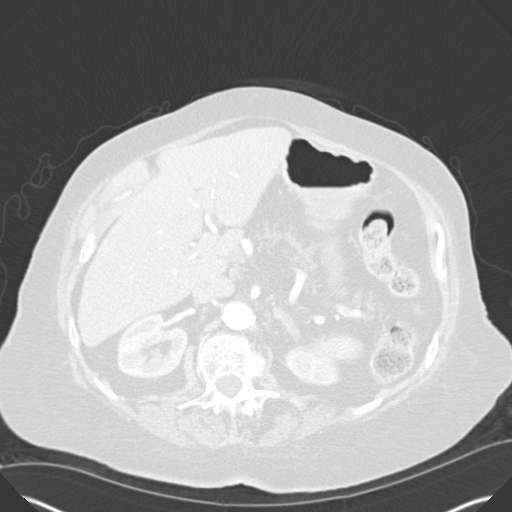
[im 15/101  lung]
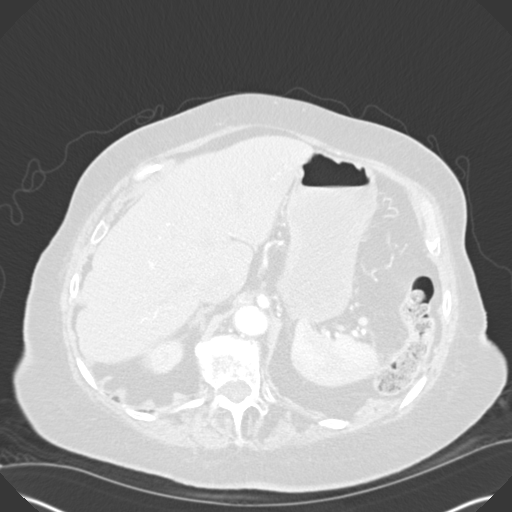
[im 21/101  lung]
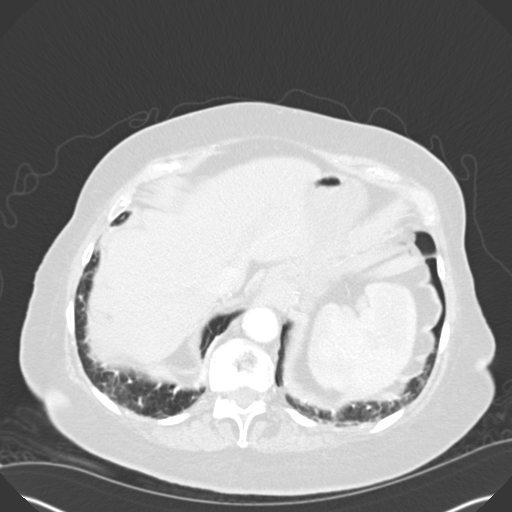
[im 26/101  lung]
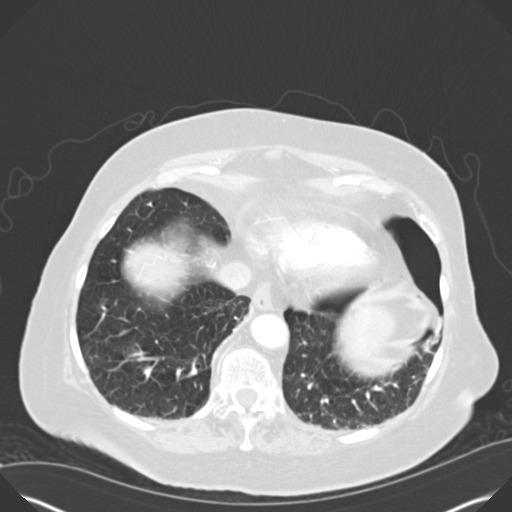
[im 34/101  mediastinal]
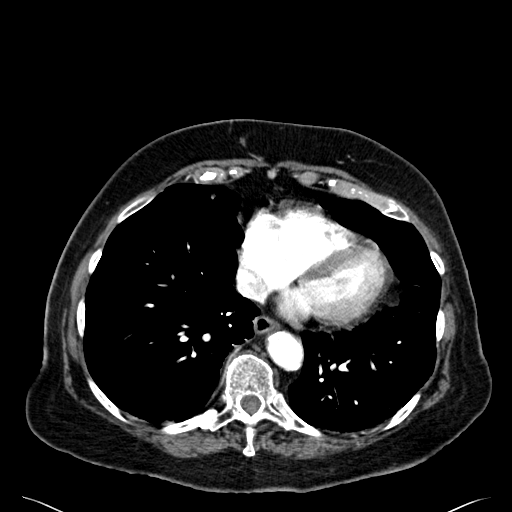
[im 34/101  lung]
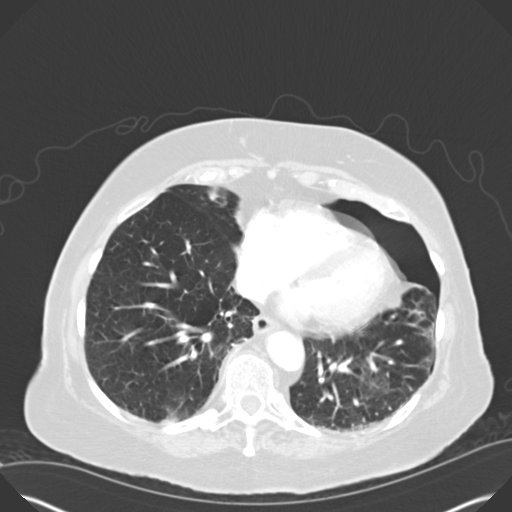
[im 41/101  lung]
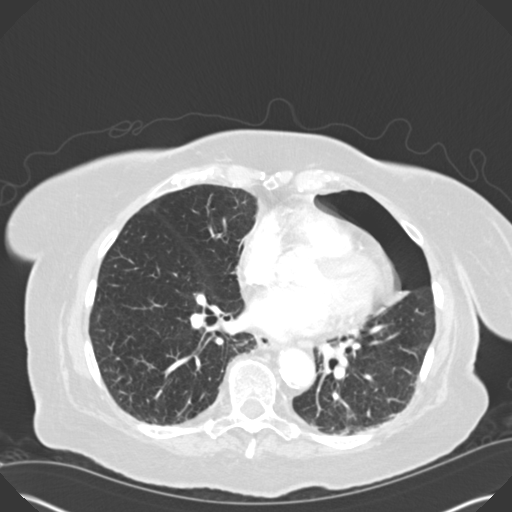
[im 48/101  lung]
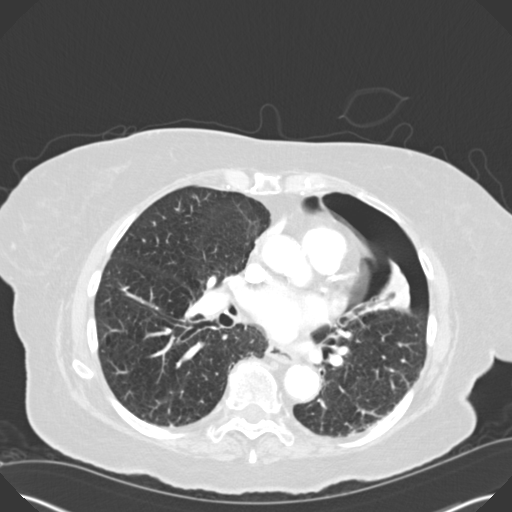
[im 52/101  lung]
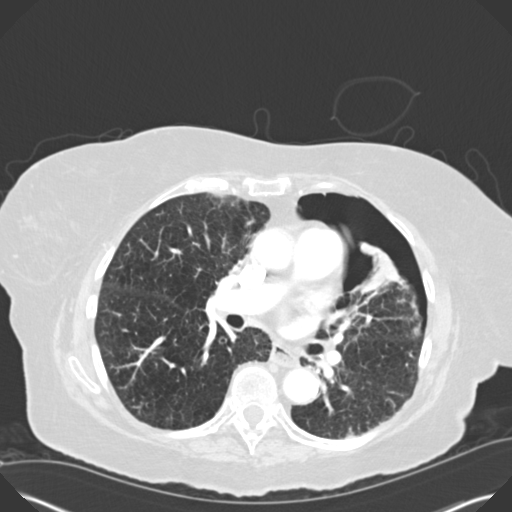
[im 56/101  mediastinal]
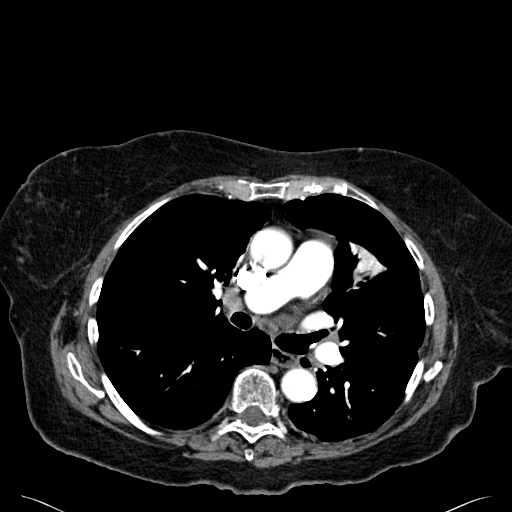
[im 56/101  lung]
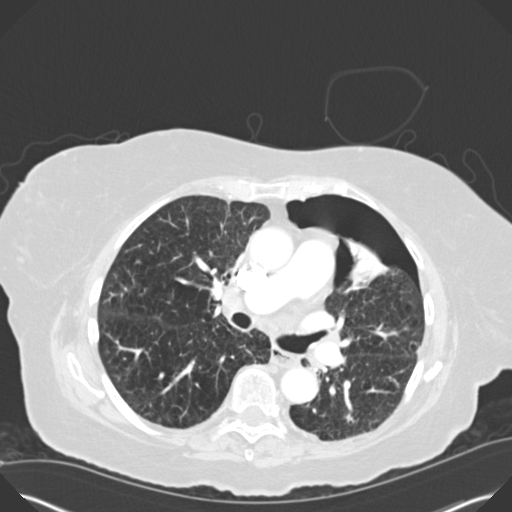
[im 61/101  lung]
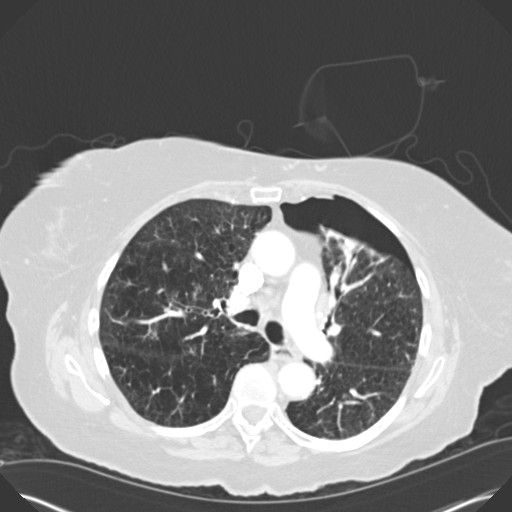
[im 67/101  lung]
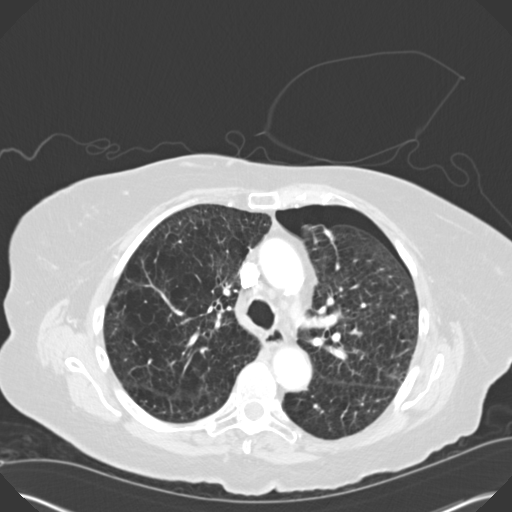
[im 75/101  lung]
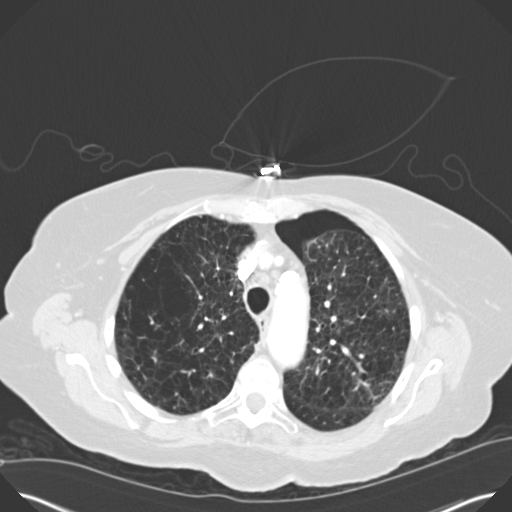
[im 81/101  mediastinal]
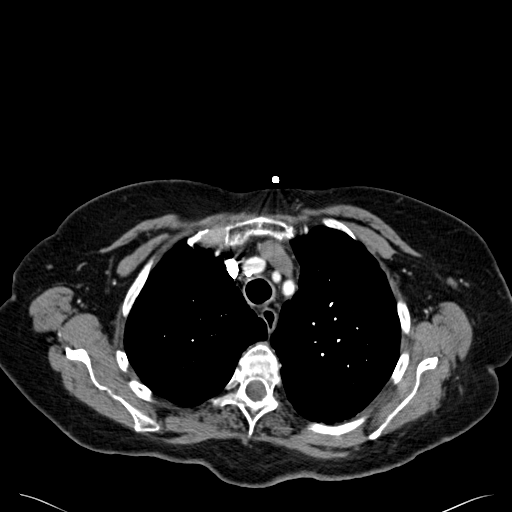
[im 81/101  lung]
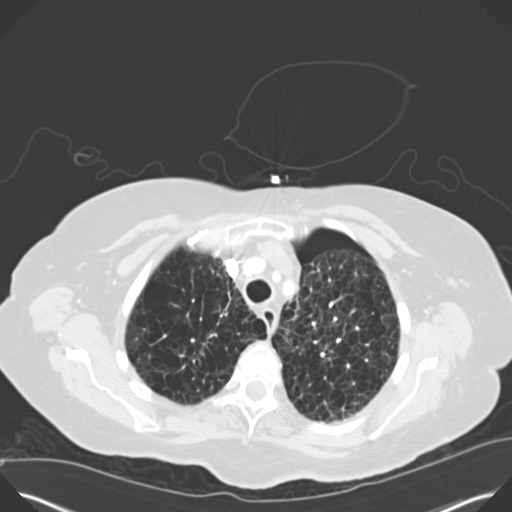
[im 86/101  lung]
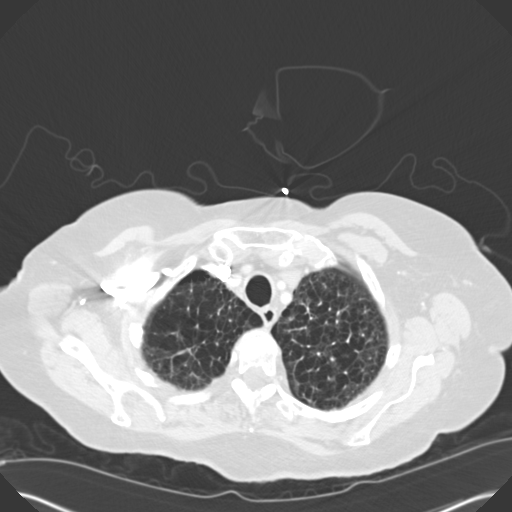
[im 93/101  lung]
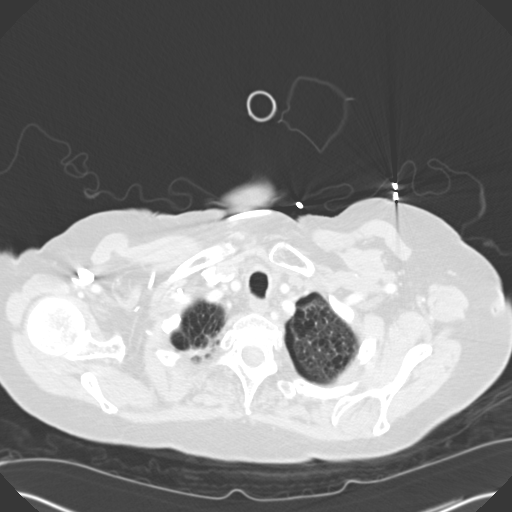

[15 of 33 positions shown; findings below may reference images not displayed]

PROCEDURE:     CT  - CT CHEST (FOR PE) W  - September 02, 2011  [DATE]

RESULT:     Axial CT scanning was performed through the chest with
reconstructions at 3 mm intervals and slice thicknesses. The patient
received 100 cc of Csovue-EBV. Review of multiplanar reconstructed images
was performed separately on the VIA monitor.

There is an approximately 20% pneumothorax on the left. There are
emphysematous changes in both lungs. There is atelectasis in the left upper
lobe extending to the pleural surface. I see no pulmonary parenchymal masses.

Contrast within the pulmonary arterial tree is normal in appearance. I see
no filling defects to suggest an acute pulmonary embolism. The cardiac
chambers are normal in size. The caliber of the thoracic aorta is normal. I
see no pathologic sized mediastinal or hilar lymph nodes. The thoracic
esophagus is normal in appearance. There is no pleural nor pericardial
effusion.

Within the upper abdomen the observed portions of the liver and spleen and
stomach exhibit no acute abnormality. I see no adrenal masses.

At bone window settings I do not see evidence of an acute displaced rib
fracture. The sternum appears intact and the thoracic vertebral bodies are
preserved in height.
IMPRESSION: 1. There is an approximately 20% pneumothorax on the left. There are changes
of COPD bilaterally and there is atelectasis of portions of the left upper
lobe.
2. I do not see evidence of an acute pulmonary embolism.
3. There is no evidence of CHF nor of a pleural nor pericardial effusion.

This report was called by me to Dr. Cergii in the [HOSPITAL]
[DATE] p.m. on the [REDACTED].

## 2013-04-13 IMAGING — CR DG CHEST 2V
1 series · 2 of 2 positions shown · non-contrast
Comparison: none

REASON FOR EXAM: shortness of breath
COMMENTS:

[Series 1: ap · 0.17mm/px · 2 of 2 slices shown]
[im 1/2]
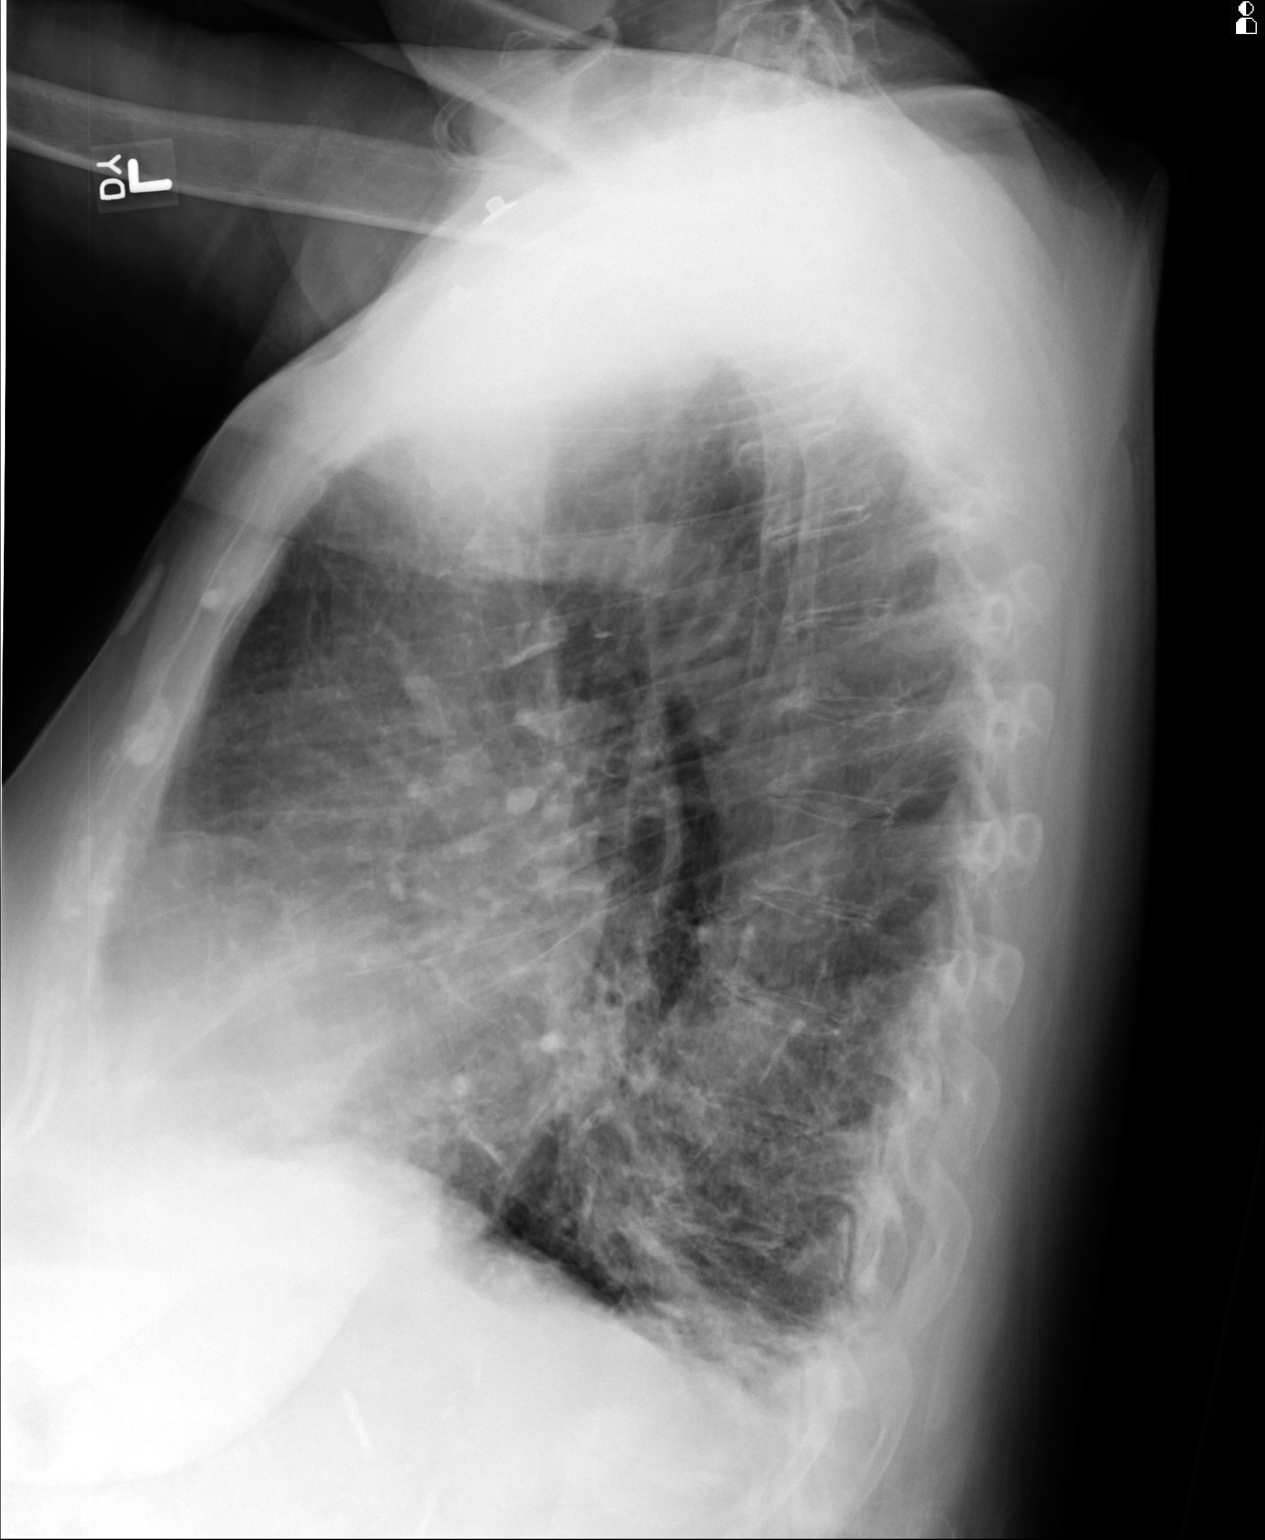
[im 2/2]
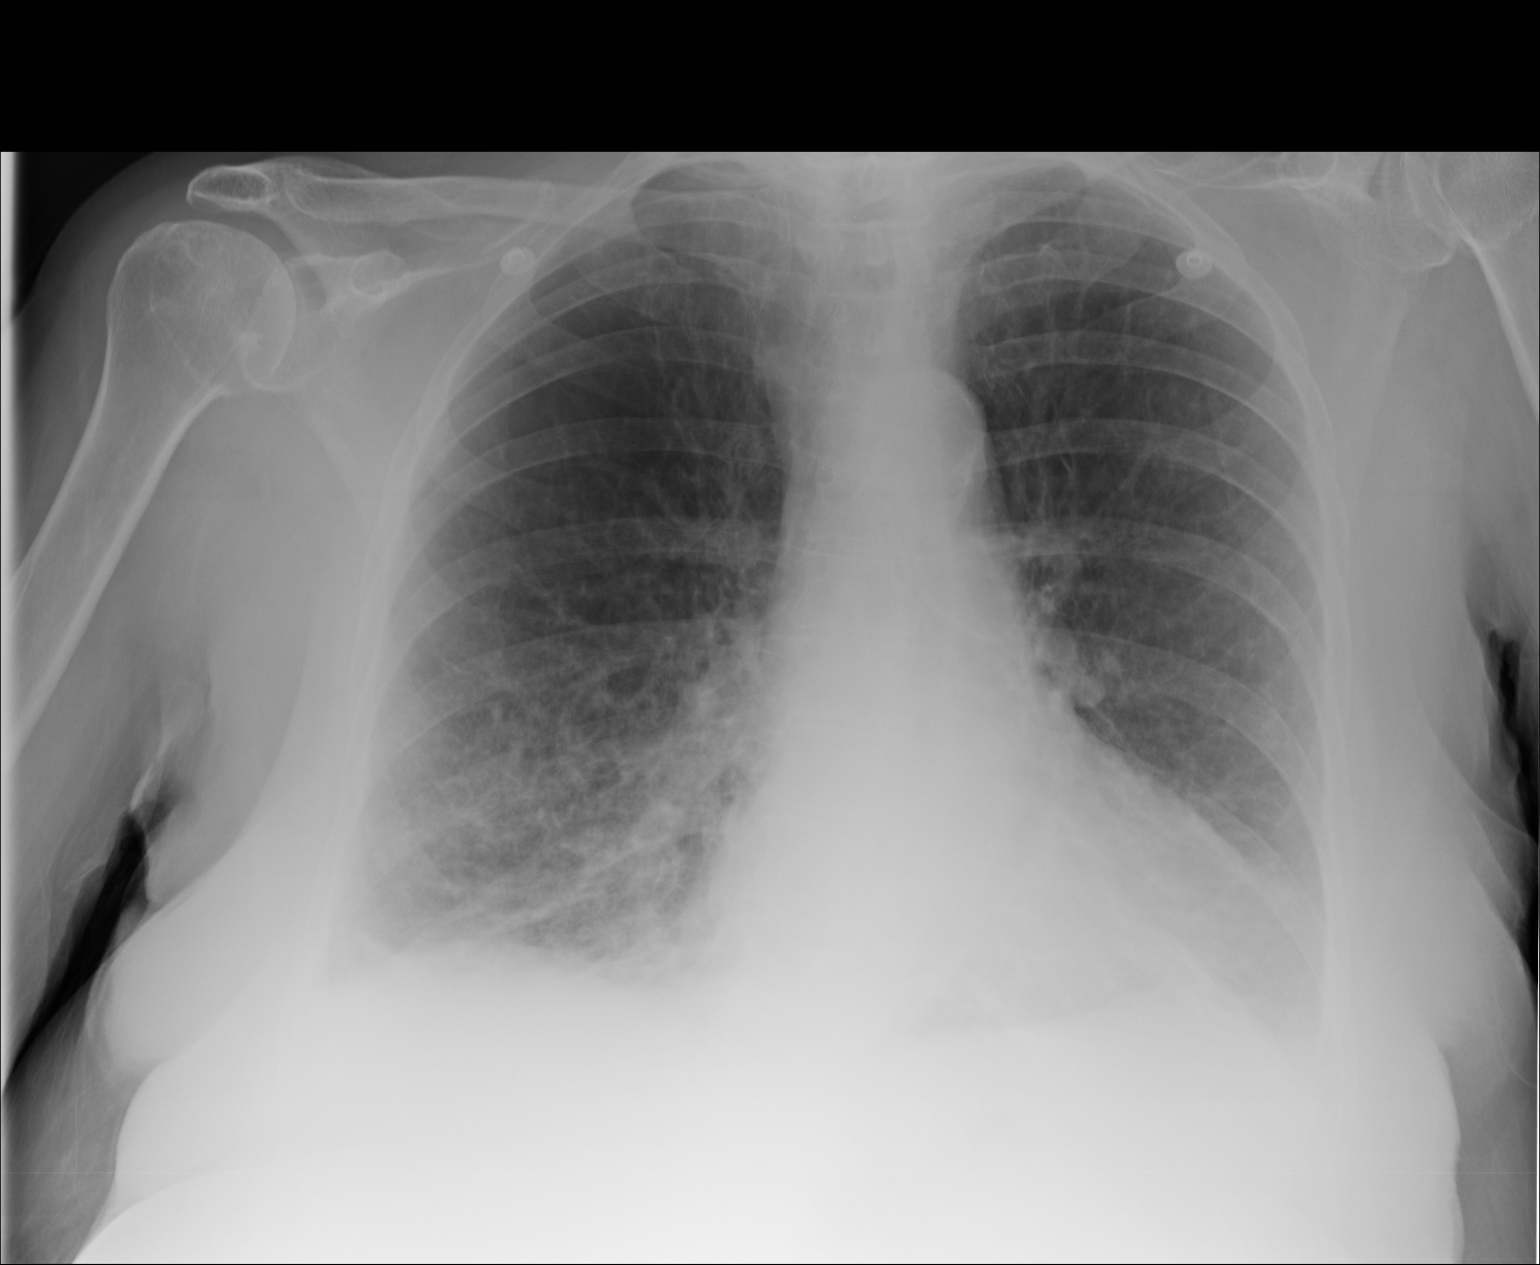

[2 of 2 positions shown; findings below may reference images not displayed]

PROCEDURE:     DXR - DXR CHEST PA (OR AP) AND LATERAL  - October 03, 2011  [DATE]

RESULT:     Comparison is made to a prior study dated 09/07/2011.

The patient has taken a shallow inspiration. There is thickening of the
interstitial markings. Peribronchial cuffing is appreciated. Areas of
increased density project within the lung bases right greater than left. The
cardiac silhouette is enlarged. The visualized bony skeleton is unremarkable.
IMPRESSION: 1. Shallow inspiration.
2. Interstitial infiltrate likely representing a component of pulmonary
edema.
3. Atelectasis versus infiltrate versus asymmetric edema within the lung
bases right greater than left. Surveillance evaluation recommended.

## 2013-04-17 IMAGING — CR DG CHEST 1V PORT
1 series · 1 of 1 positions shown · non-contrast
Comparison: none

REASON FOR EXAM: cough, chest congestion, hypoxia
COMMENTS:

PROCEDURE:     DXR - DXR PORTABLE CHEST SINGLE VIEW  - October 07, 2011 [DATE]
RESULT:     Comparison made to prior exam of 10/03/2011. Interstitial
prominence is notedagain on today's examination. Cardiac size and pulmonary
vascularity normal.

[portable]
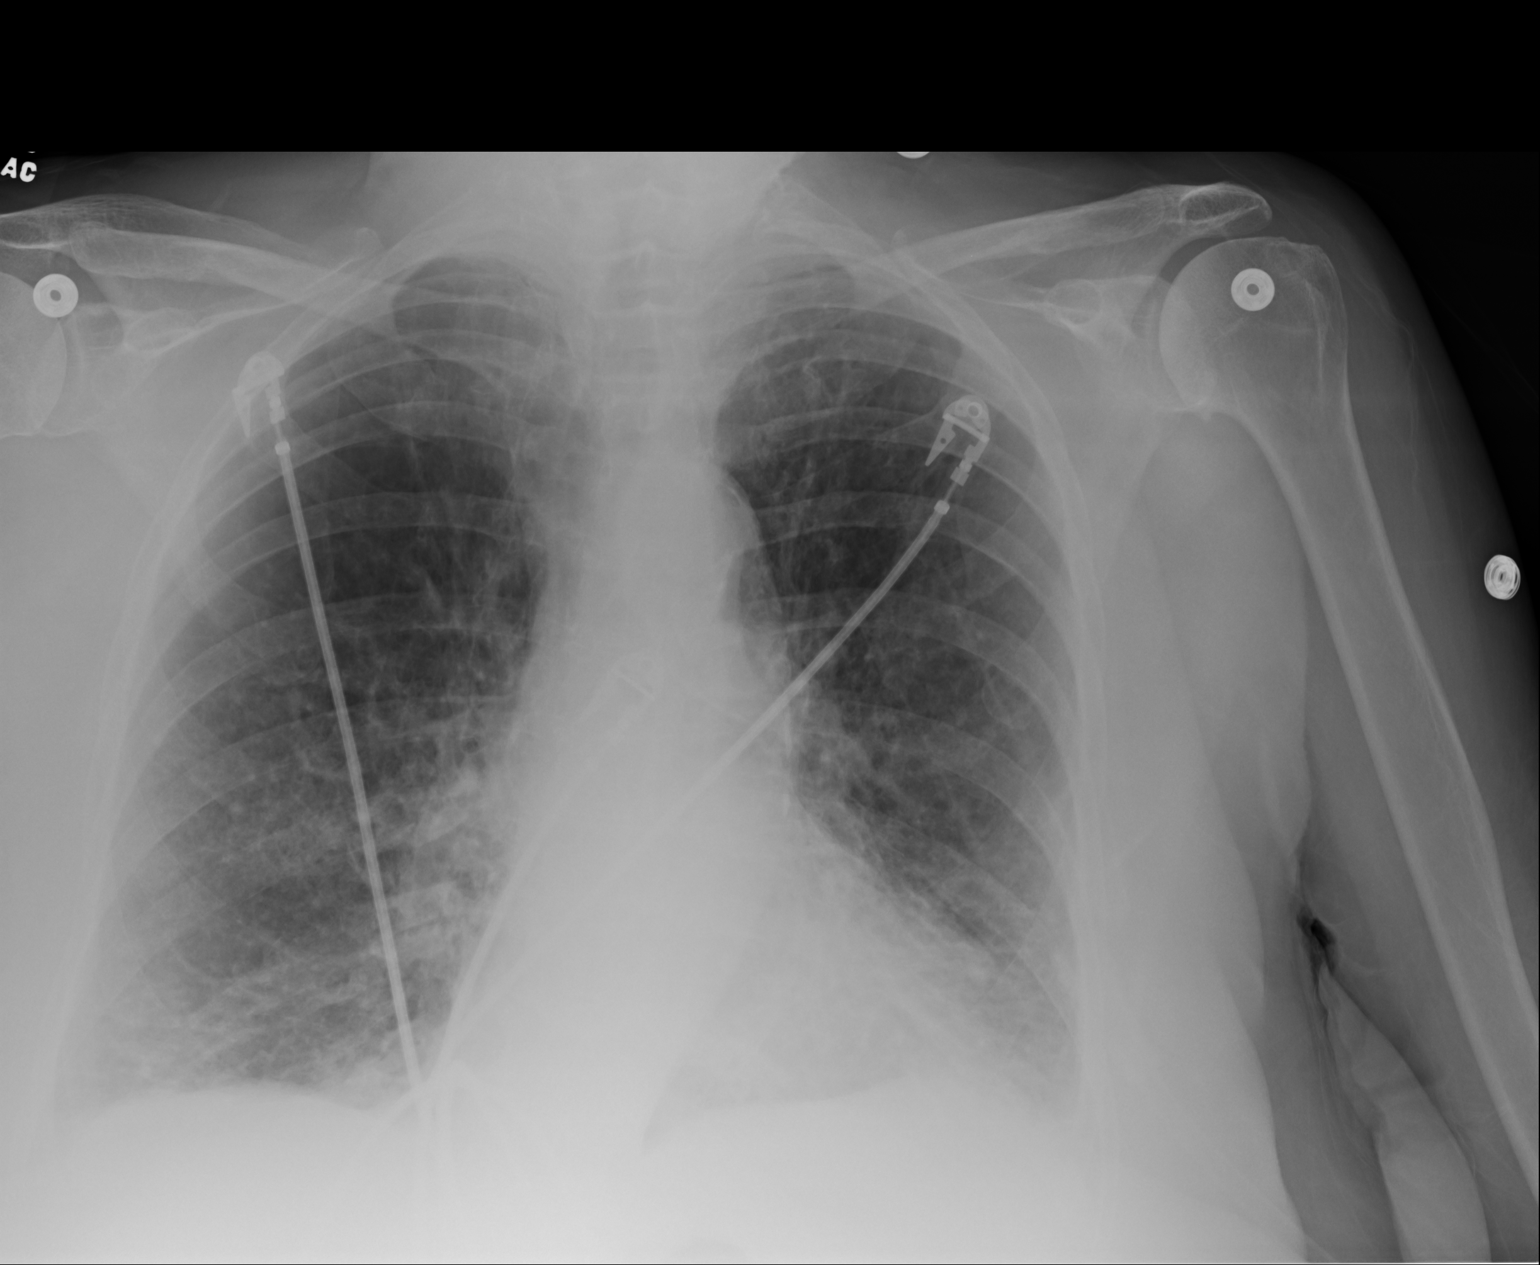

[1 of 1 positions shown; findings below may reference images not displayed]

IMPRESSION: Persistent interstitial prominence again noted on today's
examination. Interstitial prominence was noted on prior study 10/03/2011.
Interstitial prominence may have increased slightly. Interstitial
pneumonitis should be considered.

## 2014-12-30 NOTE — Discharge Summary (Signed)
PATIENT NAME:  Cathy Russell, Cathy Russell MR#:  [id number] DATE OF BIRTH:  Jan 15, 1924  DATE OF ADMISSION:  10/03/2011 DATE OF DISCHARGE:  10/08/2011   ADMITTING PHYSICIAN: Duncan Dull, MD   DISCHARGING PHYSICIAN: Larena Glassman, MD   PRIMARY CARE PHYSICIAN: Duncan Dull, MD   PRIMARY PULMONOLOGIST: Dr. Kendrick Fries of [facility]   ADMITTING DIAGNOSIS: Shortness breath, fevers.   DISCHARGE DIAGNOSES:  1. Acute respiratory failure on chronic respiratory failure secondary to chronic obstructive pulmonary disease exacerbation and underlying pneumonia.  2. Pneumonia causing fevers. 3. Chronic obstructive pulmonary disease exacerbation.  4. Hyponatremia.   5. Hypothyroidism. 6. Hyperlipidemia. 7. Hypertension.  8. Hyperglycemia likely due to steroid effect. 9. Debilitation.   CONSULTANTS:  1. Dr. Meredeth Ide  2. PT  3. Case Management   4. Respiratory Therapy    TESTS DONE DURING THIS HOSPITALIZATION:  1. Chest x-ray on 10/03/2011 showed shallow inspiration. Interstitial infiltrate likely representing a component of pulmonary edema. Atelectasis versus infiltrate.  2. CT of the chest on 10/05/2011 showed basilar pneumonia versus atelectasis. Pulmonary tissue pneumonitis versus congestive heart failure. Interim resolution of pneumothorax on the left. No evidence of pulmonary embolus. Coronary artery disease. Severe changes of COPD. Interstitial prominence could be related to pneumonitis.  3. Chest x-ray 10/07/2011 showed persistent interstitial prominence again noted on examination.   HOSPITAL COURSE: Initial history and physical were done by Dr. Darrick Huntsman. Please refer to her note dated 10/03/2011 for complete details. In brief, this is an 81 year old white female with past medical history of COPD, oxygen dependent, cor pulmonale who had 20% pneumothorax which resolved, presented to the ER with fever of 102 and shortness of breath. The patient was admitted to the hospitalist service.  1. Acute hypoxic  respiratory failure due to chronic obstructive pulmonary disease exacerbation from suspected underlying pneumonia. The patient was on IV steroids and nebulizers. She was on BiPAP initially, continued on Advair and Spiriva. She was seen by Dr. Meredeth Ide who added Theo-Dur. The patient had no PE. She had echo with EF of 55% in the past. She is still wheezing and difficult to wean off oxygen. She is requiring Venti mask now and 5 liters of oxygen at times. She had CT which showed pneumonitis and pneumothorax which has resolved. She had blood cultures which showed no growth.  2. Fever. This was most likely due to pneumonia. As noted above, the patient was treated with antibiotics. The patient was treated with antibiotics in the form of Levaquin and clindamycin which she has received for six days and will continue for another six more days.  3. Chronic obstructive pulmonary disease exacerbation suspected due to underlying pneumonia. She has received antibiotics, steroids, nebulizers, Advair, and Spiriva.   4. Hyponatremia possibly related to underlying pneumonia, SIADH. We gave IV fluids. This improved slowly. 5. Hypothyroidism. We continued Synthroid.  6. Hyperlipidemia. We continued lovastatin.  7. Hypertension. We continued metoprolol and Losartan.  8. Hyperglycemia, most likely steroid affect.  9. DVT prophylaxis. Maintained with Lovenox.  10. The patient was evaluated by PT and recommended for LTAC with physical therapy.   The patient was discharged on 10/08/2011. Temperature 97.8, heart rate 89, respiratory rate 28, blood pressure 116/62, sating 92% on 5 liters. GENERAL: The patient is well developed. LUNGS: Scant wheezing. CARDIOVASCULAR: Regular rate and rhythm. ABDOMEN: Benign.    DISCHARGE MEDICATIONS: The patient will be discharged on the following medications:  1. Spiriva 18 mcg 2 puffs inhaled once a day. 2. Synthroid 88 mcg once a day. 3.  Metoprolol 50 mg 2 times a day.  4. Losartan 50 mg  once a day  5. Advair Diskus 100/50 one puff b.i.d.  6. Lovastatin 20 mg once a day. 7. Aspirin 81 mg daily. 8. Flonase 0.05 mg inhaled nasal spray two sprays in each nostril daily.  9. Acetaminophen 650 mg extended-release 1 tablet every four hours as needed p.r.n. pain. 10. Robitussin Allergy Cough Syrup 5 mL four times a day as needed. 11. Omeprazole 20 mg daily.  12. Mucinex 600 mg 2 tablets every 12 hours as needed.  13. Lorazepam 0.5 mg as needed for anxiety, nervousness. 14. Albuterol ipratropium SVN 3 mL q.4 hours. 15. Theo-Dur 200 mg p.o. b.i.d. 16. Norco 5/325 one tablet q.6 hours p.r.n. pain. 17. Levaquin 750 mg IV daily x6 days. 18. Clindamycin 300 mg p.o. q.8 hours for six days. 19. Lovenox 40 mg sub-Q daily.  20. Solu-Medrol 40 mg IV q.8 hours. Try to wean to oral prednisone. 21. Lasix 40 mg daily which we are starting now.   22. Synthroid 88 mcg daily.   OXYGEN: 6 liters nasal cannula, possible Ventimask.   DIET: Low sodium.   ACTIVITY: Activity with assistance; PT 5 times a week.   FOLLOW-UP: She should follow-up with Dr. Darrick Huntsmanullo one week after discharge and with Dr. Kendrick FriesMcQuaid one week after discharge for LTAC.   TOTAL TIME SPENT ON DISCHARGE: 50 minutes.   Thank you for allowing me to participate in the care of this patient.     CODE STATUS: FULL CODE.   ____________________________ Corie ChiquitoAmir A. Lafayette DragonFirozvi, MD aaf:drc D: 10/08/2011 13:10:26 ET T: 10/08/2011 13:32:08 ET JOB#: [id number]  cc: Karolee OhsAmir A. Lafayette DragonFirozvi, MD, <Dictator> Lupita Leashouglas B. McQuaid, MD Duncan Dulleresa Tullo, MD Herbon E. Meredeth IdeFleming, MD Coralyn PearAMIR A Yarethzi Branan MD ELECTRONICALLY SIGNED 10/09/2011 12:02

## 2014-12-30 NOTE — H&P (Signed)
PATIENT NAME:  Cathy Russell, Cathy Russell MR#:  [id number] DATE OF BIRTH:  1924/02/13  DATE OF ADMISSION:  10/03/2011  CHIEF COMPLAINT: Shortness of breath, fevers.   HISTORY OF PRESENT ILLNESS: Ms. Mobley is an 81 year old white female with a history of endstage chronic obstructive pulmonary disease on chronic oxygen with cor pulmonale who was admitted in late December with chronic obstructive pulmonary disease exacerbation and 20% pneumothorax, which resolved prior to discharge. She was discharged to [facility] for rehab on 01/02 and presents today with a three-day history of fevers to 102.  According to the records, she was treated by the facility doctor with azithromycin and prednisone taper followed by seven days of Levaquin starting on 01/11 for productive cough and, per patient, she failed to improve. She has been very unhappy at [facility] and feels that the facilities there are not clean.  She often remembers her nasal cannula being on the floor and then being reinserted into her nose without being cleaned. She does not want to return there. In my presence today she has had cough productive of purulent, thick sputum. She is some respiratory distress and was noted to be hypoxic to 80% at admission.   PRIMARY CARE PHYSICIAN:  Dr. Duncan Dull, [facility]  PULMONOLOGIST:  Dr. [facility]   PAST MEDICAL HISTORY:  1. Advanced chronic obstructive pulmonary disease with chronic O2 use.  2. Cor pulmonale by echocardiogram December 2012 with normal ejection fraction. Hypothyroidism. 3. Hypertension.  4. History of 20% pneumothorax last admission, resolved spontaneously.  5. Irritable bowel syndrome with rectocele.  6. Mitral valve prolapse.  7. Osteoarthritis.  8. Chronic low back pain secondary to spinal stenosis. 9. Chronic lower extremity swelling secondary to venous insufficiency.   PAST SURGICAL HISTORY:  1. Appendectomy 2. Tonsillectomy. 3. Incision and  drainage of axillary abscess, remote.  4. Right eye cataract surgery.   ALLERGIES: She has had allergic reaction including rash to sulfa, penicillin, Vibramycin, and erythromycin.   MEDICATIONS:  1. Synthroid 88 mcg daily.  2. Omeprazole 20 mg daily.  3. Furosemide 40 mg daily.  4. Metoprolol tartrate 50 mg twice daily.  5. Losartan 50 mg daily. 6. Spiriva HandiHaler 18 mcg nebulized once daily.  7. Advair 100/50, 1 puff b.i.d.  8. Lovastatin 20 mg daily.  9. Flonase one squirt each nostril daily.  10. Calcium with vitamin D twice daily.  11. Meclizine 12.5 mg as needed for dizziness.  12. Ativan 0.5 mg as needed for anxiety. 13. Benzonatate 200 mg p.o. q. 8 h. p.r.n. cough.   SOCIAL HISTORY: Prior to last admission she was living at home with her daughter Netta Corrigan. She is a remote tobacco user, having quit over 20 years ago. She is mostly wheelchair-bound secondary to spinal stenosis.   FAMILY HISTORY: Notable for mother with asthma, father with emphysema. She had a brother with renal cell carcinoma.   REVIEW OF SYSTEMS: She has had fevers with fatigue, anorexia, and weakness for the last several days. No recent visual changes.  She denies epistaxis, snoring, and postnasal drip. She has had no recent difficulty swallowing. Respiratory review of systems is positive for productive cough, wheezing at rest, and dyspnea at rest. She does have a history of advanced chronic obstructive pulmonary disease. Cardiovascular review of systems is negative for chest pain. She does have two-pillow orthopnea. Denies palpitations or syncope. She denies nausea, vomiting, and diarrhea but has had anorexia for the last several days to one week.  No change in bowel habits. She has chronic constipation. She has no history of polyuria or nocturia. She does have a history of hypothyroidism, which is treated. No history of anemia or easy bruising or bleeding. She does have chronic low back pain secondary to spinal  stenosis. She does have some radiculopathy to the legs. No history of numbness weakness, dysarthria, or CVA. She does have some history of anxiety secondary to chronic obstructive pulmonary disease. No history of bipolar disorder or depression.   PHYSICAL EXAMINATION:  GENERAL: This is an elderly female who is sickly appearing. She is slightly tachypneic but does not appear to be in respiratory distress currently.  VITAL SIGNS:  Temperature is 99.9, pulse 81 and regular, blood pressure ranges from 104 systolic to 117, diastolic 55 to 58. Pulse oximetry was 80% on room air, 86% on 2 liters.   HEENT: Pupils are equal, round, and reactive to light. Extraocular movements are intact. Sclerae are anicteric. Mucosa is dry. She has dentures both upper and lower.   NECK: Supple without lymphadenopathy, thyromegaly, or carotid bruits.   LUNGS: Notable for bilateral wheezing. Diminished breath sounds in both fields.   CARDIOVASCULAR: Regular rate and rhythm with no murmurs, rubs, or gallops. Chest wall is nontender.  Pedal pulses are palpable and she has no lower extremity edema.   ABDOMEN: Soft, nontender, and nondistended with good bowel sounds and no evidence of hepatosplenomegaly.   MUSCULOSKELETAL: Strength is 5/5 in all four extremities. Gait was not tested.   SKIN: Skin is warm and dry. Skin turgor is poor.   LYMPH: She has no cervical, axillary, inguinal, or supraclavicular lymphadenopathy.   NEURO:  Grossly nonfocal.   PSYCH: She is alert and oriented to person, place, and time.   ADMISSION LABORATORY DATA: Sodium 126, potassium 3.3, chloride 84, bicarbonate 30, BUN 12, creatinine 0.79, glucose 126, white count is 14.7, hemoglobin 12, platelets 258, CK 68. MB 0.6 and troponin is less than 0.02. 12-lead EKG shows sinus rhythm with a right bundle branch block which is chronic. Chest x-ray is notable for right lower lobe pneumonia with no pneumothorax.   ASSESSMENT AND PLAN:  1. Hypoxic  respiratory failure, acute on chronic, secondary to chronic obstructive pulmonary disease exacerbation and right lower lobe pneumonia: We will admit to telemetry bed. Increase O2 to keep saturations 88% or better. IV Solu-Medrol and empiric antibiotics. Since she has been on Levaquin for the last seven days we will add clindamycin to this given her multiple other antibiotic allergies.  2. Pneumonia: As above. Broadening treatment to include anaerobic coverage with clindamycin added to Levaquin. Blood and sputum cultures have been ordered.  3. Hypertension: Continue metoprolol and losartan.  4. Hypothyroidism: Continue Synthroid.  5. Disposition:  The patient does not want to return to [facility] as she was very dissatisfied with the sanitary conditions there.  We will ask care management to initiate bed search for another facility as I assume she will need continued PT prior to returning home.   ESTIMATED TIME OF CARE: 40 minutes.  ____________________________ Duncan Dulleresa Davius Goudeau, MD tt:bjt D: 10/03/2011 21:47:36 ET T: 10/04/2011 10:08:21 ET JOB#: [id number]  cc: Duncan Dulleresa Seema Blum, MD, <Dictator> Duncan DullERESA Danija Gosa MD ELECTRONICALLY SIGNED 10/09/2011 18:23

## 2017-08-07 DEATH — deceased
